# Patient Record
Sex: Female | Born: 1970 | Race: Black or African American | Hispanic: No | Marital: Married | Smoking: Current every day smoker
Health system: Southern US, Community
[De-identification: ages and names within clinical notes are randomized; demographics above are authoritative.]

## PROBLEM LIST (undated history)

## (undated) DIAGNOSIS — I10 Essential (primary) hypertension: Secondary | ICD-10-CM

## (undated) DIAGNOSIS — R232 Flushing: Secondary | ICD-10-CM

## (undated) DIAGNOSIS — F329 Major depressive disorder, single episode, unspecified: Secondary | ICD-10-CM

## (undated) DIAGNOSIS — R87619 Unspecified abnormal cytological findings in specimens from cervix uteri: Secondary | ICD-10-CM

## (undated) DIAGNOSIS — N760 Acute vaginitis: Secondary | ICD-10-CM

## (undated) DIAGNOSIS — B9689 Other specified bacterial agents as the cause of diseases classified elsewhere: Secondary | ICD-10-CM

## (undated) DIAGNOSIS — K219 Gastro-esophageal reflux disease without esophagitis: Secondary | ICD-10-CM

## (undated) DIAGNOSIS — R110 Nausea: Secondary | ICD-10-CM

## (undated) DIAGNOSIS — R519 Headache, unspecified: Secondary | ICD-10-CM

## (undated) DIAGNOSIS — N800 Endometriosis of uterus: Secondary | ICD-10-CM

## (undated) DIAGNOSIS — R51 Headache: Secondary | ICD-10-CM

## (undated) DIAGNOSIS — F32A Depression, unspecified: Secondary | ICD-10-CM

## (undated) DIAGNOSIS — R0602 Shortness of breath: Secondary | ICD-10-CM

## (undated) DIAGNOSIS — R002 Palpitations: Secondary | ICD-10-CM

## (undated) DIAGNOSIS — N8003 Adenomyosis of the uterus: Secondary | ICD-10-CM

## (undated) DIAGNOSIS — R195 Other fecal abnormalities: Secondary | ICD-10-CM

## (undated) DIAGNOSIS — F419 Anxiety disorder, unspecified: Secondary | ICD-10-CM

## (undated) DIAGNOSIS — R42 Dizziness and giddiness: Secondary | ICD-10-CM

## (undated) DIAGNOSIS — D649 Anemia, unspecified: Secondary | ICD-10-CM

## (undated) DIAGNOSIS — A419 Sepsis, unspecified organism: Secondary | ICD-10-CM

## (undated) DIAGNOSIS — R748 Abnormal levels of other serum enzymes: Secondary | ICD-10-CM

## (undated) DIAGNOSIS — R55 Syncope and collapse: Secondary | ICD-10-CM

## (undated) DIAGNOSIS — N939 Abnormal uterine and vaginal bleeding, unspecified: Secondary | ICD-10-CM

## (undated) DIAGNOSIS — R7303 Prediabetes: Secondary | ICD-10-CM

## (undated) DIAGNOSIS — N950 Postmenopausal bleeding: Secondary | ICD-10-CM

## (undated) DIAGNOSIS — F1010 Alcohol abuse, uncomplicated: Secondary | ICD-10-CM

## (undated) HISTORY — DX: Postmenopausal bleeding: N95.0

## (undated) HISTORY — DX: Adenomyosis of the uterus: N80.03

## (undated) HISTORY — DX: Flushing: R23.2

## (undated) HISTORY — DX: Endometriosis of uterus: N80.0

## (undated) HISTORY — DX: Palpitations: R00.2

## (undated) HISTORY — PX: OTHER SURGICAL HISTORY: SHX169

## (undated) HISTORY — DX: Other fecal abnormalities: R19.5

## (undated) HISTORY — DX: Dizziness and giddiness: R42

## (undated) HISTORY — DX: Syncope and collapse: R55

## (undated) HISTORY — DX: Shortness of breath: R06.02

## (undated) HISTORY — DX: Nausea: R11.0

## (undated) HISTORY — DX: Unspecified abnormal cytological findings in specimens from cervix uteri: R87.619

## (undated) HISTORY — DX: Alcohol abuse, uncomplicated: F10.10

## (undated) HISTORY — DX: Sepsis, unspecified organism: A41.9

## (undated) HISTORY — DX: Essential (primary) hypertension: I10

## (undated) HISTORY — DX: Abnormal uterine and vaginal bleeding, unspecified: N93.9

## (undated) HISTORY — DX: Abnormal levels of other serum enzymes: R74.8

## (undated) HISTORY — DX: Prediabetes: R73.03

## (undated) HISTORY — PX: DENTAL SURGERY: SHX609

---

## 1999-08-14 ENCOUNTER — Emergency Department (HOSPITAL_COMMUNITY): Admission: EM | Admit: 1999-08-14 | Discharge: 1999-08-14 | Payer: Self-pay | Admitting: Emergency Medicine

## 1999-09-04 ENCOUNTER — Emergency Department (HOSPITAL_COMMUNITY): Admission: EM | Admit: 1999-09-04 | Discharge: 1999-09-04 | Payer: Self-pay | Admitting: Emergency Medicine

## 1999-09-09 ENCOUNTER — Inpatient Hospital Stay (HOSPITAL_COMMUNITY): Admission: AD | Admit: 1999-09-09 | Discharge: 1999-09-09 | Payer: Self-pay | Admitting: Obstetrics & Gynecology

## 2001-12-03 ENCOUNTER — Emergency Department (HOSPITAL_COMMUNITY): Admission: EM | Admit: 2001-12-03 | Discharge: 2001-12-04 | Payer: Self-pay

## 2008-05-27 ENCOUNTER — Emergency Department (HOSPITAL_COMMUNITY): Admission: EM | Admit: 2008-05-27 | Discharge: 2008-05-27 | Payer: Self-pay | Admitting: Emergency Medicine

## 2009-01-16 ENCOUNTER — Emergency Department (HOSPITAL_COMMUNITY): Admission: EM | Admit: 2009-01-16 | Discharge: 2009-01-16 | Payer: Self-pay | Admitting: Emergency Medicine

## 2009-05-21 ENCOUNTER — Emergency Department (HOSPITAL_COMMUNITY): Admission: EM | Admit: 2009-05-21 | Discharge: 2009-05-21 | Payer: Self-pay | Admitting: Emergency Medicine

## 2009-07-06 ENCOUNTER — Emergency Department (HOSPITAL_COMMUNITY): Admission: EM | Admit: 2009-07-06 | Discharge: 2009-07-06 | Payer: Self-pay | Admitting: Emergency Medicine

## 2009-11-16 ENCOUNTER — Emergency Department (HOSPITAL_COMMUNITY): Admission: EM | Admit: 2009-11-16 | Discharge: 2009-11-16 | Payer: Self-pay | Admitting: Emergency Medicine

## 2009-12-01 ENCOUNTER — Emergency Department (HOSPITAL_COMMUNITY): Admission: EM | Admit: 2009-12-01 | Discharge: 2009-12-02 | Payer: Self-pay | Admitting: Emergency Medicine

## 2009-12-23 ENCOUNTER — Inpatient Hospital Stay (HOSPITAL_COMMUNITY): Admission: EM | Admit: 2009-12-23 | Discharge: 2009-12-25 | Payer: Self-pay | Admitting: Emergency Medicine

## 2009-12-28 ENCOUNTER — Emergency Department (HOSPITAL_COMMUNITY): Admission: EM | Admit: 2009-12-28 | Discharge: 2009-12-28 | Payer: Self-pay | Admitting: Emergency Medicine

## 2010-01-03 ENCOUNTER — Emergency Department (HOSPITAL_COMMUNITY): Admission: EM | Admit: 2010-01-03 | Discharge: 2010-01-03 | Payer: Self-pay | Admitting: Emergency Medicine

## 2010-02-22 ENCOUNTER — Emergency Department (HOSPITAL_COMMUNITY): Admission: EM | Admit: 2010-02-22 | Discharge: 2010-02-23 | Payer: Self-pay | Admitting: Emergency Medicine

## 2010-04-13 ENCOUNTER — Emergency Department (HOSPITAL_COMMUNITY): Admission: EM | Admit: 2010-04-13 | Discharge: 2010-04-13 | Payer: Self-pay | Admitting: Emergency Medicine

## 2010-04-19 ENCOUNTER — Inpatient Hospital Stay (HOSPITAL_COMMUNITY): Admission: EM | Admit: 2010-04-19 | Discharge: 2010-04-21 | Payer: Self-pay | Admitting: Emergency Medicine

## 2010-04-19 ENCOUNTER — Inpatient Hospital Stay (HOSPITAL_COMMUNITY): Admission: EM | Admit: 2010-04-19 | Discharge: 2010-04-19 | Payer: Self-pay | Admitting: Emergency Medicine

## 2010-04-29 ENCOUNTER — Emergency Department (HOSPITAL_COMMUNITY): Admission: EM | Admit: 2010-04-29 | Discharge: 2010-04-29 | Payer: Self-pay | Admitting: Emergency Medicine

## 2010-05-01 ENCOUNTER — Emergency Department (HOSPITAL_COMMUNITY): Admission: EM | Admit: 2010-05-01 | Discharge: 2010-05-02 | Payer: Self-pay | Admitting: Emergency Medicine

## 2010-06-25 ENCOUNTER — Emergency Department (HOSPITAL_COMMUNITY): Admission: EM | Admit: 2010-06-25 | Discharge: 2010-06-25 | Payer: Self-pay | Admitting: Emergency Medicine

## 2010-07-30 ENCOUNTER — Emergency Department (HOSPITAL_COMMUNITY): Admission: EM | Admit: 2010-07-30 | Discharge: 2010-07-30 | Payer: Self-pay | Admitting: Emergency Medicine

## 2010-10-20 ENCOUNTER — Emergency Department (HOSPITAL_COMMUNITY): Admission: EM | Admit: 2010-10-20 | Discharge: 2010-10-20 | Payer: Self-pay | Admitting: Emergency Medicine
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
## 2011-02-08 LAB — BASIC METABOLIC PANEL WITH GFR
BUN: 4 mg/dL — ABNORMAL LOW (ref 6–23)
Creatinine, Ser: 0.67 mg/dL (ref 0.4–1.2)
GFR calc non Af Amer: >60 mL/min (ref >60)

## 2011-02-08 LAB — DIFFERENTIAL
Basophils Absolute: 0 K/uL (ref 0.0–0.1)
Basophils Relative: 0 % (ref 0–1)
Eosinophils Absolute: 0 10*3/uL (ref 0.0–0.7)
Eosinophils Relative: 0 % (ref 0–5)
Lymphocytes Relative: 7 % — ABNORMAL LOW (ref 12–46)
Lymphs Abs: 1.3 10*3/uL (ref 0.7–4.0)
Monocytes Absolute: 1.5 10*3/uL — ABNORMAL HIGH (ref 0.1–1.0)
Monocytes Relative: 8 % (ref 3–12)
Neutro Abs: 16.3 10*3/uL — ABNORMAL HIGH (ref 1.7–7.7)
Neutrophils Relative %: 85 % — ABNORMAL HIGH (ref 43–77)

## 2011-02-08 LAB — CBC
HCT: 25.8 % — ABNORMAL LOW (ref 36.0–46.0)
Hemoglobin: 8 g/dL — ABNORMAL LOW (ref 12.0–15.0)
MCH: 22.5 pg — ABNORMAL LOW (ref 26.0–34.0)
MCHC: 31 g/dL (ref 30.0–36.0)
MCV: 72.7 fL — ABNORMAL LOW (ref 78.0–100.0)
Platelets: 411 K/uL — ABNORMAL HIGH (ref 150–400)
RBC: 3.55 MIL/uL — ABNORMAL LOW (ref 3.87–5.11)
RDW: 18.6 % — ABNORMAL HIGH (ref 11.5–15.5)
WBC: 19.2 K/uL — ABNORMAL HIGH (ref 4.0–10.5)

## 2011-02-08 LAB — BASIC METABOLIC PANEL
CO2: 29 mEq/L (ref 19–32)
Calcium: 8.5 mg/dL (ref 8.4–10.5)
Chloride: 101 mEq/L (ref 96–112)
GFR calc Af Amer: >60 mL/min (ref >60)
Glucose, Bld: 98 mg/dL (ref 70–99)
Potassium: 3.6 mEq/L (ref 3.5–5.1)
Sodium: 136 mEq/L (ref 135–145)

## 2011-02-08 LAB — WET PREP, GENITAL
Trich, Wet Prep: NONE SEEN
Yeast Wet Prep HPF POC: NONE SEEN

## 2011-02-08 LAB — RPR: RPR Ser Ql: NONREACTIVE

## 2011-02-08 LAB — GC/CHLAMYDIA PROBE AMP, GENITAL
Chlamydia, DNA Probe: NEGATIVE
GC Probe Amp, Genital: POSITIVE — AB

## 2011-02-10 LAB — POCT I-STAT, CHEM 8
Calcium, Ion: 0.95 mmol/L — ABNORMAL LOW (ref 1.12–1.32)
Glucose, Bld: 91 mg/dL (ref 70–99)
HCT: 37 % (ref 36.0–46.0)
Hemoglobin: 12.6 g/dL (ref 12.0–15.0)

## 2011-02-10 LAB — POCT PREGNANCY, URINE: Preg Test, Ur: NEGATIVE

## 2011-02-12 LAB — POCT PREGNANCY, URINE: Preg Test, Ur: NEGATIVE

## 2011-02-12 LAB — DIFFERENTIAL
Band Neutrophils: 0 % (ref 0–10)
Basophils Relative: 0 % (ref 0–1)
Blasts: 0 %
Lymphocytes Relative: 39 % (ref 12–46)
Lymphs Abs: 2.7 10*3/uL (ref 0.7–4.0)
Monocytes Absolute: 0.6 10*3/uL (ref 0.1–1.0)
Monocytes Relative: 9 % (ref 3–12)
Neutro Abs: 3.4 10*3/uL (ref 1.7–7.7)
Neutrophils Relative %: 51 % (ref 43–77)

## 2011-02-12 LAB — HEMOGLOBIN AND HEMATOCRIT, BLOOD
HCT: 27.3 % — ABNORMAL LOW (ref 36.0–46.0)
Hemoglobin: 8.7 g/dL — ABNORMAL LOW (ref 12.0–15.0)

## 2011-02-12 LAB — CBC
HCT: 28.5 % — ABNORMAL LOW (ref 36.0–46.0)
Hemoglobin: 9 g/dL — ABNORMAL LOW (ref 12.0–15.0)
WBC: 6.8 10*3/uL (ref 4.0–10.5)

## 2011-02-12 LAB — POCT I-STAT, CHEM 8
BUN: 15 mg/dL (ref 6–23)
Calcium, Ion: 1.16 mmol/L (ref 1.12–1.32)
Glucose, Bld: 82 mg/dL (ref 70–99)
TCO2: 24 mmol/L (ref 0–100)

## 2011-02-12 LAB — URINE MICROSCOPIC-ADD ON

## 2011-02-12 LAB — POCT CARDIAC MARKERS: Troponin i, poc: <0.05 ng/mL (ref 0.00–0.09)

## 2011-02-12 LAB — URINALYSIS, ROUTINE W REFLEX MICROSCOPIC

## 2011-02-13 LAB — COMPREHENSIVE METABOLIC PANEL
AST: 31 U/L (ref 0–37)
Albumin: 4 g/dL (ref 3.5–5.2)
Alkaline Phosphatase: 75 U/L (ref 39–117)
BUN: 7 mg/dL (ref 6–23)
Chloride: 103 mEq/L (ref 96–112)
GFR calc Af Amer: >60 mL/min (ref >60)
Potassium: 3.4 mEq/L — ABNORMAL LOW (ref 3.5–5.1)
Sodium: 137 mEq/L (ref 135–145)
Total Bilirubin: 0.2 mg/dL — ABNORMAL LOW (ref 0.3–1.2)
Total Protein: 8 g/dL (ref 6.0–8.3)

## 2011-02-13 LAB — DIFFERENTIAL
Basophils Absolute: 0 10*3/uL (ref 0.0–0.1)
Basophils Relative: 1 % (ref 0–1)
Eosinophils Relative: 2 % (ref 0–5)
Monocytes Absolute: 0.5 10*3/uL (ref 0.1–1.0)
Monocytes Relative: 6 % (ref 3–12)
Neutro Abs: 4 10*3/uL (ref 1.7–7.7)

## 2011-02-13 LAB — URINALYSIS, ROUTINE W REFLEX MICROSCOPIC
Hgb urine dipstick: NEGATIVE
Nitrite: NEGATIVE
Protein, ur: NEGATIVE mg/dL
Specific Gravity, Urine: 1.007 (ref 1.005–1.030)
Urobilinogen, UA: 0.2 mg/dL (ref 0.0–1.0)

## 2011-02-13 LAB — ETHANOL: Alcohol, Ethyl (B): 167 mg/dL — ABNORMAL HIGH (ref 0–10)

## 2011-02-13 LAB — CBC
Platelets: 373 10*3/uL (ref 150–400)
RDW: 18.4 % — ABNORMAL HIGH (ref 11.5–15.5)
WBC: 7.1 10*3/uL (ref 4.0–10.5)

## 2011-02-14 LAB — COMPREHENSIVE METABOLIC PANEL
ALT: 15 U/L (ref 0–35)
AST: 25 U/L (ref 0–37)
AST: 30 U/L (ref 0–37)
Albumin: 3.9 g/dL (ref 3.5–5.2)
Albumin: 3.1 g/dL — ABNORMAL LOW (ref 3.5–5.2)
Alkaline Phosphatase: 48 U/L (ref 39–117)
Alkaline Phosphatase: 59 U/L (ref 39–117)
BUN: 5 mg/dL — ABNORMAL LOW (ref 6–23)
BUN: 3 mg/dL — ABNORMAL LOW (ref 6–23)
CO2: 21 mEq/L (ref 19–32)
CO2: 25 mEq/L (ref 19–32)
CO2: 27 mEq/L (ref 19–32)
Calcium: 8.7 mg/dL (ref 8.4–10.5)
Calcium: 7.9 mg/dL — ABNORMAL LOW (ref 8.4–10.5)
Chloride: 108 mEq/L (ref 96–112)
Chloride: 109 mEq/L (ref 96–112)
Chloride: 110 mEq/L (ref 96–112)
Creatinine, Ser: 0.7 mg/dL (ref 0.4–1.2)
GFR calc Af Amer: >60 mL/min (ref >60)
GFR calc Af Amer: >60 mL/min (ref >60)
GFR calc non Af Amer: >60 mL/min (ref >60)
GFR calc non Af Amer: >60 mL/min (ref >60)
GFR calc non Af Amer: >60 mL/min (ref >60)
Glucose, Bld: 105 mg/dL — ABNORMAL HIGH (ref 70–99)
Glucose, Bld: 82 mg/dL (ref 70–99)
Potassium: 3.4 mEq/L — ABNORMAL LOW (ref 3.5–5.1)
Potassium: 3.5 mEq/L (ref 3.5–5.1)
Sodium: 139 mEq/L (ref 135–145)
Sodium: 141 mEq/L (ref 135–145)
Total Bilirubin: <0.1 mg/dL — ABNORMAL LOW (ref 0.3–1.2)
Total Bilirubin: 0.2 mg/dL — ABNORMAL LOW (ref 0.3–1.2)
Total Bilirubin: 0.2 mg/dL — ABNORMAL LOW (ref 0.3–1.2)

## 2011-02-14 LAB — GRAM STAIN

## 2011-02-14 LAB — CBC
HCT: 26.9 % — ABNORMAL LOW (ref 36.0–46.0)
HCT: 27 % — ABNORMAL LOW (ref 36.0–46.0)
HCT: 33.5 % — ABNORMAL LOW (ref 36.0–46.0)
Hemoglobin: 8.5 g/dL — ABNORMAL LOW (ref 12.0–15.0)
Hemoglobin: 8.5 g/dL — ABNORMAL LOW (ref 12.0–15.0)
Hemoglobin: 8.5 g/dL — ABNORMAL LOW (ref 12.0–15.0)
Hemoglobin: 9 g/dL — ABNORMAL LOW (ref 12.0–15.0)
Hemoglobin: 9.1 g/dL — ABNORMAL LOW (ref 12.0–15.0)
Hemoglobin: 10 g/dL — ABNORMAL LOW (ref 12.0–15.0)
MCHC: 30.8 g/dL (ref 30.0–36.0)
MCHC: 31.4 g/dL (ref 30.0–36.0)
MCHC: 31.5 g/dL (ref 30.0–36.0)
MCHC: 32 g/dL (ref 30.0–36.0)
MCHC: 32.3 g/dL (ref 30.0–36.0)
MCHC: 31.6 g/dL (ref 30.0–36.0)
MCV: 75.2 fL — ABNORMAL LOW (ref 78.0–100.0)
MCV: 75.3 fL — ABNORMAL LOW (ref 78.0–100.0)
MCV: 75.5 fL — ABNORMAL LOW (ref 78.0–100.0)
MCV: 75.6 fL — ABNORMAL LOW (ref 78.0–100.0)
MCV: 75.7 fL — ABNORMAL LOW (ref 78.0–100.0)
MCV: 75.8 fL — ABNORMAL LOW (ref 78.0–100.0)
MCV: 76.3 fL — ABNORMAL LOW (ref 78.0–100.0)
Platelets: 228 10*3/uL (ref 150–400)
Platelets: 254 10*3/uL (ref 150–400)
Platelets: 289 10*3/uL (ref 150–400)
Platelets: 343 10*3/uL (ref 150–400)
Platelets: 295 10*3/uL (ref 150–400)
Platelets: 450 10*3/uL — ABNORMAL HIGH (ref 150–400)
RBC: 3.59 MIL/uL — ABNORMAL LOW (ref 3.87–5.11)
RBC: 3.69 MIL/uL — ABNORMAL LOW (ref 3.87–5.11)
RBC: 3.8 MIL/uL — ABNORMAL LOW (ref 3.87–5.11)
RBC: 4.1 MIL/uL (ref 3.87–5.11)
RBC: 4.17 MIL/uL (ref 3.87–5.11)
RDW: 18.8 % — ABNORMAL HIGH (ref 11.5–15.5)
RDW: 19.2 % — ABNORMAL HIGH (ref 11.5–15.5)
RDW: 19.4 % — ABNORMAL HIGH (ref 11.5–15.5)
RDW: 20 % — ABNORMAL HIGH (ref 11.5–15.5)
WBC: 3.6 10*3/uL — ABNORMAL LOW (ref 4.0–10.5)
WBC: 5.1 10*3/uL (ref 4.0–10.5)
WBC: 6.7 10*3/uL (ref 4.0–10.5)
WBC: 10.2 10*3/uL (ref 4.0–10.5)
WBC: 6.7 10*3/uL (ref 4.0–10.5)

## 2011-02-14 LAB — GLUCOSE, CAPILLARY
Glucose-Capillary: 115 mg/dL — ABNORMAL HIGH (ref 70–99)
Glucose-Capillary: 60 mg/dL — ABNORMAL LOW (ref 70–99)
Glucose-Capillary: 71 mg/dL (ref 70–99)
Glucose-Capillary: 77 mg/dL (ref 70–99)
Glucose-Capillary: 85 mg/dL (ref 70–99)
Glucose-Capillary: 98 mg/dL (ref 70–99)

## 2011-02-14 LAB — URINALYSIS, ROUTINE W REFLEX MICROSCOPIC
Bilirubin Urine: NEGATIVE
Bilirubin Urine: NEGATIVE
Bilirubin Urine: NEGATIVE
Glucose, UA: NEGATIVE mg/dL
Glucose, UA: NEGATIVE mg/dL
Glucose, UA: NEGATIVE mg/dL
Hgb urine dipstick: NEGATIVE
Hgb urine dipstick: NEGATIVE
Ketones, ur: NEGATIVE mg/dL
Ketones, ur: NEGATIVE mg/dL
Nitrite: NEGATIVE
Protein, ur: NEGATIVE mg/dL
Protein, ur: NEGATIVE mg/dL
Specific Gravity, Urine: 1.011 (ref 1.005–1.030)
Specific Gravity, Urine: 1.005 (ref 1.005–1.030)
Urobilinogen, UA: 0.2 mg/dL (ref 0.0–1.0)
pH: 5.5 (ref 5.0–8.0)
pH: 5.5 (ref 5.0–8.0)

## 2011-02-14 LAB — TYPE AND SCREEN
ABO/RH(D): O NEG
Antibody Screen: NEGATIVE

## 2011-02-14 LAB — DIFFERENTIAL
Basophils Absolute: 0 10*3/uL (ref 0.0–0.1)
Eosinophils Absolute: 0.2 10*3/uL (ref 0.0–0.7)
Eosinophils Relative: 1 % (ref 0–5)
Eosinophils Relative: 3 % (ref 0–5)
Eosinophils Relative: 0 % (ref 0–5)
Lymphocytes Relative: 49 % — ABNORMAL HIGH (ref 12–46)
Lymphocytes Relative: 27 % (ref 12–46)
Lymphs Abs: 2.5 10*3/uL (ref 0.7–4.0)
Lymphs Abs: 3.2 10*3/uL (ref 0.7–4.0)
Lymphs Abs: 1.8 10*3/uL (ref 0.7–4.0)
Monocytes Absolute: 0.4 10*3/uL (ref 0.1–1.0)
Monocytes Absolute: 0.8 10*3/uL (ref 0.1–1.0)
Monocytes Relative: 8 % (ref 3–12)
Monocytes Relative: 8 % (ref 3–12)
Neutrophils Relative %: 66 % (ref 43–77)
Neutrophils Relative %: 80 % — ABNORMAL HIGH (ref 43–77)

## 2011-02-14 LAB — VITAMIN B12: Vitamin B-12: 479 pg/mL (ref 211–911)

## 2011-02-14 LAB — CARDIAC PANEL(CRET KIN+CKTOT+MB+TROPI)
CK, MB: 1.7 ng/mL (ref 0.3–4.0)
Relative Index: 1 (ref 0.0–2.5)
Relative Index: 1 (ref 0.0–2.5)
Total CK: 163 U/L (ref 7–177)

## 2011-02-14 LAB — IRON AND TIBC
Iron: 13 ug/dL — ABNORMAL LOW (ref 42–135)
UIBC: 350 ug/dL

## 2011-02-14 LAB — BASIC METABOLIC PANEL
CO2: 27 mEq/L (ref 19–32)
Chloride: 105 mEq/L (ref 96–112)
GFR calc Af Amer: >60 mL/min (ref >60)
Sodium: 137 mEq/L (ref 135–145)

## 2011-02-14 LAB — RAPID URINE DRUG SCREEN, HOSP PERFORMED
Amphetamines: NOT DETECTED
Barbiturates: NOT DETECTED
Benzodiazepines: NOT DETECTED

## 2011-02-14 LAB — PROTIME-INR
INR: 1.05 (ref 0.00–1.49)
Prothrombin Time: 13.6 seconds (ref 11.6–15.2)

## 2011-02-14 LAB — ETHANOL
Alcohol, Ethyl (B): 303 mg/dL — ABNORMAL HIGH (ref 0–10)
Alcohol, Ethyl (B): 225 mg/dL — ABNORMAL HIGH (ref 0–10)

## 2011-02-14 LAB — RETICULOCYTES
RBC.: 3.84 MIL/uL — ABNORMAL LOW (ref 3.87–5.11)
Retic Count, Absolute: 23 10*3/uL (ref 19.0–186.0)
Retic Ct Pct: 0.6 % (ref 0.4–3.1)

## 2011-02-14 LAB — POCT I-STAT, CHEM 8
BUN: 4 mg/dL — ABNORMAL LOW (ref 6–23)
BUN: <3 mg/dL — ABNORMAL LOW (ref 6–23)
Creatinine, Ser: 0.9 mg/dL (ref 0.4–1.2)
Creatinine, Ser: 0.6 mg/dL (ref 0.4–1.2)
Creatinine, Ser: 0.8 mg/dL (ref 0.4–1.2)
Glucose, Bld: 95 mg/dL (ref 70–99)
Hemoglobin: 12.2 g/dL (ref 12.0–15.0)
Hemoglobin: 11.6 g/dL — ABNORMAL LOW (ref 12.0–15.0)
Hemoglobin: 11.9 g/dL — ABNORMAL LOW (ref 12.0–15.0)
Potassium: 3.4 mEq/L — ABNORMAL LOW (ref 3.5–5.1)
Potassium: 3.4 mEq/L — ABNORMAL LOW (ref 3.5–5.1)
Sodium: 142 mEq/L (ref 135–145)
Sodium: 133 mEq/L — ABNORMAL LOW (ref 135–145)
TCO2: 22 mmol/L (ref 0–100)

## 2011-02-14 LAB — CULTURE, ROUTINE-ABSCESS

## 2011-02-14 LAB — URINE MICROSCOPIC-ADD ON

## 2011-02-14 LAB — ANAEROBIC CULTURE

## 2011-02-14 LAB — APTT: aPTT: 28 seconds (ref 24–37)

## 2011-02-14 LAB — TSH: TSH: 1.221 u[IU]/mL (ref 0.350–4.500)

## 2011-02-14 LAB — HEMOCCULT GUIAC POC 1CARD (OFFICE): Fecal Occult Bld: POSITIVE

## 2011-02-14 LAB — LIPASE, BLOOD: Lipase: 32 U/L (ref 11–59)

## 2011-02-16 LAB — ETHANOL: Alcohol, Ethyl (B): 346 mg/dL — ABNORMAL HIGH (ref 0–10)

## 2011-02-16 LAB — GLUCOSE, CAPILLARY: Glucose-Capillary: 92 mg/dL (ref 70–99)

## 2011-02-21 LAB — GLUCOSE, CAPILLARY: Glucose-Capillary: 108 mg/dL — ABNORMAL HIGH (ref 70–99)

## 2011-03-05 LAB — POCT PREGNANCY, URINE: Preg Test, Ur: NEGATIVE

## 2011-03-05 LAB — GC/CHLAMYDIA PROBE AMP, GENITAL: GC Probe Amp, Genital: POSITIVE — AB

## 2011-03-05 LAB — WET PREP, GENITAL: Yeast Wet Prep HPF POC: NONE SEEN

## 2011-03-05 LAB — URINALYSIS, ROUTINE W REFLEX MICROSCOPIC
Glucose, UA: NEGATIVE mg/dL
Ketones, ur: 15 mg/dL — AB
Protein, ur: 100 mg/dL — AB
pH: 6 (ref 5.0–8.0)

## 2011-03-05 LAB — URINE MICROSCOPIC-ADD ON

## 2011-03-15 LAB — RAPID URINE DRUG SCREEN, HOSP PERFORMED
Amphetamines: NOT DETECTED
Barbiturates: NOT DETECTED
Benzodiazepines: NOT DETECTED
Cocaine: NOT DETECTED
Opiates: NOT DETECTED

## 2011-03-15 LAB — URINALYSIS, ROUTINE W REFLEX MICROSCOPIC
Bilirubin Urine: NEGATIVE
Nitrite: NEGATIVE
Specific Gravity, Urine: 1.009 (ref 1.005–1.030)
pH: 5 (ref 5.0–8.0)

## 2011-03-15 LAB — POCT I-STAT, CHEM 8
BUN: 3 mg/dL — ABNORMAL LOW (ref 6–23)
Calcium, Ion: 1.03 mmol/L — ABNORMAL LOW (ref 1.12–1.32)
Creatinine, Ser: 0.7 mg/dL (ref 0.4–1.2)
Glucose, Bld: 73 mg/dL (ref 70–99)
Hemoglobin: 14.3 g/dL (ref 12.0–15.0)
Sodium: 141 mEq/L (ref 135–145)
TCO2: 24 mmol/L (ref 0–100)

## 2011-03-15 LAB — URINE MICROSCOPIC-ADD ON

## 2011-03-15 LAB — URINE CULTURE: Colony Count: 10000

## 2011-04-12 NOTE — Consult Note (Signed)
NAMECHARLISSA, [name]              ACCOUNT NO.:  [id number]   MEDICAL RECORD NO.:  [id number]          PATIENT TYPE:  EMS   LOCATION:  [facility]                         FACILITY:  [facility]   PHYSICIAN:  Karol T. Lazarus Salines, M.D. DATE OF BIRTH:  01-Feb-1971   DATE OF CONSULTATION:  05/27/2008  DATE OF DISCHARGE:  05/27/2008                                 CONSULTATION   CHIEF COMPLAINT:  Lip laceration.   HISTORY:  A 40 year old black female allegedly tripped and fell striking  her lower lip against iron bed frame sustaining a large laceration.  She  looked in the mirror and recognized that it was substantial and came  into the emergency room.  She had some bleeding at home, which stopped  spontaneously.  She feels like her upper lateral right incisor may be  slightly loose, but was not knocked clean nor were any other teeth  injured.  She is breathing comfortably.  The pain is moderate.  She did  not lose consciousness.   PAST MEDICAL HISTORY:  Noncontributory.   SOCIAL HISTORY:  She is here with her boyfriend.  She both smokes and  drinks alcohol.   FAMILY HISTORY:  Noncontributory.   REVIEW OF SYSTEMS:  Noncontributory.   PHYSICAL EXAMINATION:  GENERAL:  This is a pleasant middle-aged black  female.  HEENT:  A large full-thickness laceration of the lower lip just off-  centered to the right of midline.  The portion, which traverses lip is  basically perpendicular to the vermilion line and then angulates on the  interior mucosal surface.  It extends onto the skin of the exterior lip  for a minimal distance.  No active bleeding noted.  The teeth looked  relatively intact.  She does smell of alcohol.  Mental status is  basically intact.  She hears well in conversational speech.  Voice is  clear and respirations unlabored through nose and mouth.  The head is  otherwise atraumatic and neck supple.  I did not examine ears or nose.  The oral cavity is clear.  She has the laceration of the  lower lip as  described above.  NECK:  Unremarkable.  NEUROLOGIC:  Cranial nerves intact.   IMPRESSION:  Full-thickness laceration of the lower lip with good  cosmetic orientation.   PLAN:  I discussed this with the patient.  I recommend we close this  under local anesthesia.  She is receptive.   I began local anesthesia with Cetacaine spray into the lower  gingivobuccal sulcus on both sides, following which, I infiltrated 10 mL  total of 1% Xylocaine with 1:100,000 epinephrine to perform a mental  nerve block on each side.  Several minutes were allowed for this to take  effect.  Good local anesthesia was achieved.  The face and wound was  cleaned with a 50/50 mixture of Betadine solution and saline.  The  patient did receive a tetanus vaccination in the emergency room.   The deep tissue spaces were reapproximated using interrupted 4-0 Vicryl  sutures.  The vermilion-cutaneous junction was carefully reapproximated  using a 5-0 Ethilon stitch.  Several additional  sutures were applied to  the skin of the lower lip, and then the same suture was used to suture  on the external portion of the lips mucosa.  Upon reaching the moist  portion of the lip, the suture was changed to 5-0 chromic gut, which was  carried down into the interior surface.  A good cosmetic reapproximation  of the lip was accomplished.  The patient tolerated the procedure  beautifully.   I talked with her about ice, elevation, and wound hygiene.  I gave her a  prescription for Vicodin 5/500 pain relief.  I will see her back in 7-8  days for suture removal.  She will stay with softer diet for the time-  being and advance as comfortable.  She understands and agrees with the  discussion and plans.      Gloris Manchester. Lazarus Salines, M.D.  Electronically Signed     KTW/MEDQ  D:  05/27/2008  T:  05/28/2008  Job:  [id number]

## 2012-11-23 ENCOUNTER — Emergency Department (HOSPITAL_COMMUNITY)
Admission: EM | Admit: 2012-11-23 | Discharge: 2012-11-23 | Disposition: A | Discharge status: Home / Self Care | Payer: Self-pay | Attending: Emergency Medicine | Admitting: Emergency Medicine

## 2012-11-23 ENCOUNTER — Encounter (HOSPITAL_COMMUNITY): Payer: Self-pay | Admitting: Emergency Medicine

## 2012-11-23 DIAGNOSIS — J329 Chronic sinusitis, unspecified: Secondary | ICD-10-CM | POA: Insufficient documentation

## 2012-11-23 DIAGNOSIS — H9210 Otorrhea, unspecified ear: Secondary | ICD-10-CM | POA: Insufficient documentation

## 2012-11-23 DIAGNOSIS — J069 Acute upper respiratory infection, unspecified: Secondary | ICD-10-CM | POA: Insufficient documentation

## 2012-11-23 DIAGNOSIS — Z8619 Personal history of other infectious and parasitic diseases: Secondary | ICD-10-CM | POA: Insufficient documentation

## 2012-11-23 DIAGNOSIS — R059 Cough, unspecified: Secondary | ICD-10-CM | POA: Insufficient documentation

## 2012-11-23 DIAGNOSIS — F17200 Nicotine dependence, unspecified, uncomplicated: Secondary | ICD-10-CM | POA: Insufficient documentation

## 2012-11-23 DIAGNOSIS — R05 Cough: Secondary | ICD-10-CM | POA: Insufficient documentation

## 2012-11-23 DIAGNOSIS — J029 Acute pharyngitis, unspecified: Secondary | ICD-10-CM | POA: Insufficient documentation

## 2012-11-23 DIAGNOSIS — R6883 Chills (without fever): Secondary | ICD-10-CM | POA: Insufficient documentation

## 2012-11-23 DIAGNOSIS — R6889 Other general symptoms and signs: Secondary | ICD-10-CM | POA: Insufficient documentation

## 2012-11-23 DIAGNOSIS — R1110 Vomiting, unspecified: Secondary | ICD-10-CM | POA: Insufficient documentation

## 2012-11-23 MED ORDER — IBUPROFEN 600 MG PO TABS: 600.0000 mg | ORAL_TABLET | Freq: Four times a day (QID) | ORAL | Status: DC | PRN

## 2012-11-23 MED ORDER — OXYMETAZOLINE HCL 0.05 % NA SOLN: 2.0000 | Freq: Two times a day (BID) | NASAL | Status: DC

## 2012-11-23 MED ORDER — MOMETASONE FUROATE 50 MCG/ACT NA SUSP: 2.0000 | Freq: Every day | NASAL | Status: DC

## 2012-11-23 NOTE — ED Notes (Signed)
Pt had abscess surgery 3-4 years ago from tooth decay that spread into left-side of jaw. Today pt has earache and pain on same left side of jaw. A&Ox4, ambulatory, nad.

## 2012-11-23 NOTE — ED Notes (Signed)
Pt c/o left earache two days with yellow drainage. Tenderness behind left ear radiating to left jaw, no visible swelling noted. Pt's spouse states that pt sounds slurred in speech. Pt reports hoarse voice that has been worsening since onset of earache. A&Ox4, ambulatory, nad.

## 2012-11-23 NOTE — ED Provider Notes (Signed)
History  This chart was scribed for Terry Racer, MD by Bennett Scrape, ED Scribe. This patient was seen in room TR05C/TR05C and the patient's care was started at 1:04 PM.   CSN: [id number]  Arrival date & time 11/23/12  1148   First MD Initiated Contact with Patient 11/23/12 1304      Chief Complaint  Patient presents with  . Otalgia    Patient is a 41 y.o. female presenting with ear pain. The history is provided by the patient. No language interpreter was used.  Otalgia This is a new problem. The current episode started 2 days ago. There is pain in the left ear. The problem occurs constantly. The problem has been gradually worsening. There has been no fever. The pain is moderate. Associated symptoms include ear discharge, sore throat, vomiting (post-tussive) and cough. Pertinent negatives include no abdominal pain and no diarrhea. Her past medical history does not include hearing loss.    [name] is a 41 y.o. female who presents to the Emergency Department complaining of 2 days of gradual onset, gradually worsening, constant left otalgia that radiates behind the ear with associated yellow drainage, sneezing, sore throat, cough, 4 episodes of post-tussive emesis and chills. The pain is worse with loud noises. She reports trying to clean the ear with Q-tips and irrigating it with no improvement. She reports one prior episode of similar symptoms with a facial abscess. She denies fevers and hearing loss as associated symptoms. She does not have a h/o chronic medical conditions and is a current everyday smoker and occasional alcohol user.  No past medical history on file.  Past Surgical History  Procedure Date  . Abscess     No family history on file.  History  Substance Use Topics  . Smoking status: Current Every Day Smoker  . Smokeless tobacco: Not on file  . Alcohol Use: Yes    No OB history provided.  Review of Systems  Constitutional: Positive for chills.  Negative for fever.  HENT: Positive for ear pain (left), sore throat, sneezing and ear discharge. Negative for congestion.   Respiratory: Positive for cough. Negative for shortness of breath.   Gastrointestinal: Positive for vomiting (post-tussive). Negative for nausea, abdominal pain and diarrhea.  All other systems reviewed and are negative.    Allergies  Morphine and related and Penicillins  Home Medications   Current Outpatient Rx  Name  Route  Sig  Dispense  Refill  . IBUPROFEN 200 MG PO TABS   Oral   Take 400 mg by mouth every 6 (six) hours as needed. For pain         . IBUPROFEN 600 MG PO TABS   Oral   Take 1 tablet (600 mg total) by mouth every 6 (six) hours as needed for pain.   30 tablet   0   . MOMETASONE FUROATE 50 MCG/ACT NA SUSP   Nasal   Place 2 sprays into the nose daily.   17 g   12   . OXYMETAZOLINE HCL 0.05 % NA SOLN   Nasal   Place 2 sprays into the nose 2 (two) times daily.   30 mL   0     Triage Vitals: BP 133/107  Pulse 88  Temp 98.1 F (36.7 C) (Oral)  Resp 18  SpO2 100%  LMP 11/17/2012  Physical Exam  Nursing note and vitals reviewed. Constitutional: She is oriented to person, place, and time. She appears well-developed and well-nourished. No distress.  HENT:  Head: Normocephalic and atraumatic.       Bilateral TM bulging, bilateral turbinate edema, no erythema, left maxillary sinus tenderness to percussion  Eyes: EOM are normal.  Neck: Neck supple. No tracheal deviation present.  Cardiovascular: Normal rate and regular rhythm.   Pulmonary/Chest: Effort normal and breath sounds normal. No respiratory distress.  Musculoskeletal: Normal range of motion.  Neurological: She is alert and oriented to person, place, and time.  Skin: Skin is warm and dry.  Psychiatric: She has a normal mood and affect. Her behavior is normal.    ED Course  Procedures (including critical care time)  DIAGNOSTIC STUDIES: Oxygen Saturation is 100% on  room air, normal by my interpretation.    COORDINATION OF CARE: 1:17 PM- Discussed discharge plan which includes nasal sprays with pt at bedside and pt agreed to plan. Addressed pt's concerns over BP and advised her to follow up with her PCP.  1:20 PM- Prescribed Nasonex nasal spray, Afrin nasal spray and 600 mg ibuprofen tablets  Labs Reviewed - No data to display No results found.   1. Sinusitis   2. Acute URI       MDM  I personally performed the services described in this documentation, which was scribed in my presence. The recorded information has been reviewed and is accurate.     Terry Racer, MD 11/23/12 [phone]

## 2012-11-23 NOTE — ED Notes (Signed)
Pt also reports N/V x2 days.

## 2015-06-11 ENCOUNTER — Emergency Department (HOSPITAL_COMMUNITY)
Admission: EM | Admit: 2015-06-11 | Discharge: 2015-06-11 | Disposition: A | Discharge status: Home / Self Care | Payer: Self-pay | Attending: Emergency Medicine | Admitting: Emergency Medicine

## 2015-06-11 ENCOUNTER — Encounter (HOSPITAL_COMMUNITY): Payer: Self-pay | Admitting: Neurology

## 2015-06-11 DIAGNOSIS — Z720 Tobacco use: Secondary | ICD-10-CM | POA: Insufficient documentation

## 2015-06-11 DIAGNOSIS — Z3202 Encounter for pregnancy test, result negative: Secondary | ICD-10-CM | POA: Insufficient documentation

## 2015-06-11 DIAGNOSIS — D649 Anemia, unspecified: Secondary | ICD-10-CM | POA: Insufficient documentation

## 2015-06-11 DIAGNOSIS — N939 Abnormal uterine and vaginal bleeding, unspecified: Secondary | ICD-10-CM

## 2015-06-11 DIAGNOSIS — N938 Other specified abnormal uterine and vaginal bleeding: Secondary | ICD-10-CM | POA: Insufficient documentation

## 2015-06-11 DIAGNOSIS — R42 Dizziness and giddiness: Secondary | ICD-10-CM | POA: Insufficient documentation

## 2015-06-11 LAB — CBC WITH DIFFERENTIAL/PLATELET
BASOS ABS: 0 10*3/uL (ref 0.0–0.1)
Basophils Relative: 0 % (ref 0–1)
EOS ABS: 0.1 10*3/uL (ref 0.0–0.7)
Eosinophils Relative: 1 % (ref 0–5)
HEMATOCRIT: 33.2 % — AB (ref 36.0–46.0)
Hemoglobin: 10.8 g/dL — ABNORMAL LOW (ref 12.0–15.0)
LYMPHS ABS: 2.6 10*3/uL (ref 0.7–4.0)
Lymphocytes Relative: 39 % (ref 12–46)
MCH: 26.9 pg (ref 26.0–34.0)
MCHC: 32.5 g/dL (ref 30.0–36.0)
MCV: 82.8 fL (ref 78.0–100.0)
MONO ABS: 0.6 10*3/uL (ref 0.1–1.0)
Monocytes Relative: 9 % (ref 3–12)
NEUTROS PCT: 51 % (ref 43–77)
Neutro Abs: 3.3 10*3/uL (ref 1.7–7.7)
PLATELETS: 303 10*3/uL (ref 150–400)
RBC: 4.01 MIL/uL (ref 3.87–5.11)
RDW: 18.6 % — ABNORMAL HIGH (ref 11.5–15.5)
WBC: 6.6 10*3/uL (ref 4.0–10.5)

## 2015-06-11 LAB — I-STAT BETA HCG BLOOD, ED (MC, WL, AP ONLY): I-stat hCG, quantitative: <5 m[IU]/mL (ref <5)

## 2015-06-11 MED ORDER — FERROUS SULFATE 325 (65 FE) MG PO TABS: 325.0000 mg | ORAL_TABLET | Freq: Every day | ORAL | Status: DC

## 2015-06-11 MED ORDER — MEDROXYPROGESTERONE ACETATE 5 MG PO TABS: 5.0000 mg | ORAL_TABLET | Freq: Every day | ORAL | Status: DC

## 2015-06-11 NOTE — Discharge Instructions (Signed)
1. Medications:medroxyprogesterone, iron supplement, usual home medications 2. Treatment: rest, drink plenty of fluids,  3. Follow Up: Please followup with OB/GYN in 7days for discussion of your diagnoses and further evaluation after today's visit; if you do not have a primary care doctor use the resource guide provided to find one; Please return to the ER for increased bleeding, syncope, CP or SOB    Dysfunctional Uterine Bleeding Normally, menstrual periods begin between ages 38 to 67 in young women. A normal menstrual cycle/period may begin every 23 days up to 35 days and lasts from 1 to 7 days. Around 12 to 14 days before your menstrual period starts, ovulation (ovary produces an egg) occurs. When counting the time between menstrual periods, count from the first day of bleeding of the previous period to the first day of bleeding of the next period. Dysfunctional (abnormal) uterine bleeding is bleeding that is different from a normal menstrual period. Your periods may come earlier or later than usual. They may be lighter, have blood clots or be heavier. You may have bleeding between periods, or you may skip one period or more. You may have bleeding after sexual intercourse, bleeding after menopause, or no menstrual period. CAUSES   Pregnancy (normal, miscarriage, tubal).  IUDs (intrauterine device, birth control).  Birth control pills.  Hormone treatment.  Menopause.  Infection of the cervix.  Blood clotting problems.  Infection of the inside lining of the uterus.  Endometriosis, inside lining of the uterus growing in the pelvis and other female organs.  Adhesions (scar tissue) inside the uterus.  Obesity or severe weight loss.  Uterine polyps inside the uterus.  Cancer of the vagina, cervix, or uterus.  Ovarian cysts or polycystic ovary syndrome.  Medical problems (diabetes, thyroid disease).  Uterine fibroids (noncancerous tumor).  Problems with your female  hormones.  Endometrial hyperplasia, very thick lining and enlarged cells inside of the uterus.  Medicines that interfere with ovulation.  Radiation to the pelvis or abdomen.  Chemotherapy. DIAGNOSIS   Your doctor will discuss the history of your menstrual periods, medicines you are taking, changes in your weight, stress in your life, and any medical problems you may have.  Your doctor will do a physical and pelvic examination.  Your doctor may want to perform certain tests to make a diagnosis, such as:  Pap test.  Blood tests.  Cultures for infection.  CT scan.  Ultrasound.  Hysteroscopy.  Laparoscopy.  MRI.  Hysterosalpingography.  D and C.  Endometrial biopsy. TREATMENT  Treatment will depend on the cause of the dysfunctional uterine bleeding (DUB). Treatment may include:  Observing your menstrual periods for a couple of months.  Prescribing medicines for medical problems, including:  Antibiotics.  Hormones.  Birth control pills.  Removing an IUD (intrauterine device, birth control).  Surgery:  D and C (scrape and remove tissue from inside the uterus).  Laparoscopy (examine inside the abdomen with a lighted tube).  Uterine ablation (destroy lining of the uterus with electrical current, laser, heat, or freezing).  Hysteroscopy (examine cervix and uterus with a lighted tube).  Hysterectomy (remove the uterus). HOME CARE INSTRUCTIONS   If medicines were prescribed, take exactly as directed. Do not change or switch medicines without consulting your caregiver.  Long term heavy bleeding may result in iron deficiency. Your caregiver may have prescribed iron pills. They help replace the iron that your body lost from heavy bleeding. Take exactly as directed.  Do not take aspirin or medicines that contain aspirin one  week before or during your menstrual period. Aspirin may make the bleeding worse.  If you need to change your sanitary pad or tampon more  than once every 2 hours, stay in bed with your feet elevated and a cold pack on your lower abdomen. Rest as much as possible, until the bleeding stops or slows down.  Eat well-balanced meals. Eat foods high in iron. Examples are:  Leafy green vegetables.  Whole-grain breads and cereals.  Eggs.  Meat.  Liver.  Do not try to lose weight until the abnormal bleeding has stopped and your blood iron level is back to normal. Do not lift more than ten pounds or do strenuous activities when you are bleeding.  For a couple of months, make note on your calendar, marking the start and ending of your period, and the type of bleeding (light, medium, heavy, spotting, clots or missed periods). This is for your caregiver to better evaluate your problem. SEEK MEDICAL CARE IF:   You develop nausea (feeling sick to your stomach) and vomiting, dizziness, or diarrhea while you are taking your medicine.  You are getting lightheaded or weak.  You have any problems that may be related to the medicine you are taking.  You develop pain with your DUB.  You want to remove your IUD.  You want to stop or change your birth control pills or hormones.  You have any type of abnormal bleeding mentioned above.  You are over 45 years old and have not had a menstrual period yet.  You are 44 years old and you are still having menstrual periods.  You have any of the symptoms mentioned above.  You develop a rash. SEEK IMMEDIATE MEDICAL CARE IF:   An oral temperature above 102 F (38.9 C) develops.  You develop chills.  You are changing your sanitary pad or tampon more than once an hour.  You develop abdominal pain.  You pass out or faint. Document Released: 11/11/2000 Document Revised: 02/06/2012 Document Reviewed: 10/13/2009 [employer] Patient Information 2015 [place]. This information is not intended to replace advice given to you by your health care provider. Make sure you discuss any questions  you have with your health care provider.

## 2015-06-11 NOTE — ED Provider Notes (Signed)
CSN: [id number]   Arrival date & time 06/11/15 1627  History  This chart was scribed for non-physician practitioner, Terry ForthHannah Crue Otero PA-C, working with Terry SproutWhitney Plunkett, MD by [name], ED Scribe. This patient was seen in room TR01C/TR01C and the patient's care was started at 8:03 PM.  Chief Complaint  Patient presents with  . Vaginal Bleeding    HPI The history is provided by the patient and medical records. No language interpreter was used.   [name] is a 44 y.o. female with PMHx of anemia who presents to the Emergency Department complaining of heavy vaginal bleeding with onset 14 days ago. She uses a pad every half hour and notes passing clots. Prior to this her last period was in June and her periods usually last 3 days. Associated symptoms include intermittent dizziness and "tight" suprapubic lower abdominal cramping. The pain started at both sides but is now concentrated in the middle of the abdomen. Pt denies vaginal pain, dysuria, vaginal discharge. Sexually active with no birth control. She had a normal pap smear in September of 2015. She had a cervical biopsy several years ago. No known family history of cervical or uterine cancer.  Pt denies CP, SOB, syncope or near syncope.    History reviewed. No pertinent past medical history.  History reviewed. No pertinent past surgical history.  No family history on file.  History  Substance Use Topics  . Smoking status: Current Every Day Smoker  . Smokeless tobacco: Not on file  . Alcohol Use: Yes     Review of Systems  Constitutional: Negative for fever, diaphoresis, appetite change, fatigue and unexpected weight change.  HENT: Negative for mouth sores.   Eyes: Negative for visual disturbance.  Respiratory: Negative for cough, chest tightness, shortness of breath and wheezing.   Cardiovascular: Negative for chest pain.  Gastrointestinal: Positive for abdominal pain (suprapubic abd cramping). Negative for nausea,  vomiting, diarrhea and constipation.  Endocrine: Negative for polydipsia, polyphagia and polyuria.  Genitourinary: Positive for vaginal bleeding. Negative for dysuria, urgency, frequency and hematuria.       Vaginal bleeding No vaginal pain  Musculoskeletal: Negative for back pain and neck stiffness.  Skin: Negative for rash.  Allergic/Immunologic: Negative for immunocompromised state.  Neurological: Positive for dizziness (intermittent). Negative for syncope, light-headedness and headaches.  Hematological: Does not bruise/bleed easily.  Psychiatric/Behavioral: Negative for sleep disturbance. The patient is not nervous/anxious.     Home Medications   Prior to Admission medications   Medication Sig Start Date End Date Taking? Authorizing Provider  ibuprofen (ADVIL,MOTRIN) 200 MG tablet Take 600 mg by mouth every 6 (six) hours as needed for mild pain.   Yes Historical Provider, MD  ferrous sulfate 325 (65 FE) MG tablet Take 1 tablet (325 mg total) by mouth daily. 06/11/15   Terry Burandt, PA-C  medroxyPROGESTERone (PROVERA) 5 MG tablet Take 1 tablet (5 mg total) by mouth daily. 3 tablets for 3 days, 2 tablets for 3 days, 1 tablet for 3 days 06/11/15   Terry ClientHannah Amelita Risinger, PA-C    Allergies  Review of patient's allergies indicates no known allergies.  Triage Vitals: BP 153/95 mmHg  Pulse 86  Temp(Src) 98.1 F (36.7 C) (Oral)  Resp 16  SpO2 100%  LMP 05/24/2015  Physical Exam  Constitutional: She appears well-developed and well-nourished. No distress.  HENT:  Head: Normocephalic and atraumatic.  Eyes: Conjunctivae are normal.  Neck: Normal range of motion.  Cardiovascular: Normal rate, regular rhythm, normal heart sounds and intact distal pulses.  No murmur heard. Pulmonary/Chest: Effort normal and breath sounds normal. No respiratory distress. She has no wheezes.  Abdominal: Soft. Bowel sounds are normal. There is no tenderness. There is no rebound and no guarding. Hernia  confirmed negative in the right inguinal area and confirmed negative in the left inguinal area.  Genitourinary: Uterus normal. No labial fusion. There is no rash, tenderness or lesion on the right labia. There is no rash, tenderness or lesion on the left labia. Uterus is not deviated, not enlarged, not fixed and not tender. Cervix exhibits no motion tenderness, no discharge and no friability. Right adnexum displays no mass, no tenderness and no fullness. Left adnexum displays no mass, no tenderness and no fullness. There is bleeding in the vagina. No erythema or tenderness in the vagina. No foreign body around the vagina. No signs of injury around the vagina. No vaginal discharge found.  Numerous cervical cysts noted  Bleeding from the cervical os.    Musculoskeletal: Normal range of motion. She exhibits no edema.  Lymphadenopathy:       Right: No inguinal adenopathy present.       Left: No inguinal adenopathy present.  Neurological: She is alert.  Skin: Skin is warm and dry. She is not diaphoretic. No erythema.  Psychiatric: She has a normal mood and affect.  Nursing note and vitals reviewed.   ED Course  Procedures   DIAGNOSTIC STUDIES: Oxygen Saturation is 100% on RA, normal by my interpretation.    COORDINATION OF CARE: 8:17 PM Discussed treatment plan which includes lab work and discharge with progesterone to f/u at the [facility] in a week with pt at bedside and pt agreed to plan.  8:49 PM As requested by the pt, I updated the patient's husband on the results of her medical screening and exam and informed him of the treatment plan.    Labs Review-  Labs Reviewed  CBC WITH DIFFERENTIAL/PLATELET - Abnormal; Notable for the following:    Hemoglobin 10.8 (*)    HCT 33.2 (*)    RDW 18.6 (*)    All other components within normal limits  WET PREP, GENITAL  WET PREP, GENITAL  I-STAT BETA HCG BLOOD, ED ([facility], [facility], [facility] ONLY)  GC/CHLAMYDIA PROBE AMP ([place]) NOT AT [facility]     Imaging Review No results found.  EKG Interpretation None      MDM   Final diagnoses:  Vaginal bleeding  Anemia, unspecified anemia type   [name] presents for 2 weeks of vaginal bleeding and suprapubic cramping.  Negative pregnancy test, mild anemia.  Pt reports a hx of anemia but does not know her baseline.  Pt with nontender exam but vaginal vault is full of bright red blood.  No clots noted.  No obvious discharge, no CMT.  Pt will be d/c with medroxyprogesterone and close F/U with GYN.  Numerous cysts noted on cervix; clinically appear to be nabothian cysts but potential for cervical dysplasia discussed.  Pt will follow with OB/GYN within 1 week.   Pt without tachycardia, SOB or symptomatic anemia.   No hypotension.  BP 136/57 mmHg  Pulse 85  Temp(Src) 98.1 F (36.7 C) (Oral)  Resp 16  SpO2 99%  LMP 05/24/2015  I personally performed the services described in this documentation, which was scribed in my presence. The recorded information has been reviewed and is accurate.      Terry Client Kaylob Wallen, PA-C 06/11/15 2115  Terry Sprout, MD 06/12/15 2246

## 2015-06-11 NOTE — ED Notes (Signed)
Spoke with Lab states just saw tube will run now.

## 2015-06-11 NOTE — ED Notes (Signed)
Pt reports heavy vaginal bleeding for 14 days, with clots. Having to change tampons every hour.

## 2015-06-12 ENCOUNTER — Telehealth: Payer: Self-pay | Admitting: *Deleted

## 2015-06-12 LAB — WET PREP, GENITAL
Trich, Wet Prep: NONE SEEN
Yeast Wet Prep HPF POC: NONE SEEN

## 2015-06-12 LAB — GC/CHLAMYDIA PROBE AMP (~~LOC~~) NOT AT ARMC
CHLAMYDIA, DNA PROBE: NEGATIVE
Neisseria Gonorrhea: NEGATIVE

## 2015-06-12 NOTE — Care Management (Signed)
ED CM received call from patient regarding [facility] appt. Patient called this morning and stated, that she was given an appointment in August. EDP referral was within the next 3 days. ED CM [facility] with a sooner appt. Patient confirmed contact information.  Patient also states, she needs a work note.  Spoke with H. Muttersbaugh PA-C concerning note, provided verbal ok for work note. Encouraged patient to continue with discharge instructions and regimen until follow up is attained  No further ED CM needs identified

## 2015-06-12 NOTE — ED Notes (Signed)
Confirmation of scheduled appt obtained.

## 2015-06-12 NOTE — Progress Notes (Addendum)
  June 12, 2015  Patient: [name]  Date of Visit: 06/11/2015    To Whom It May Concern:  [name] was seen and treated in our emergency department on 06/11/2015. [name]  She may return to normal activities on Sunday 06/14/2015  Sincerely,   Michel BickersWandalyn Bon Dowis RN BSN CNOR  ED Case Manager [phone]

## 2015-06-15 ENCOUNTER — Encounter (HOSPITAL_COMMUNITY): Payer: Self-pay | Admitting: Emergency Medicine

## 2015-07-04 ENCOUNTER — Inpatient Hospital Stay (HOSPITAL_COMMUNITY)
Admission: AD | Admit: 2015-07-04 | Discharge: 2015-07-04 | Discharge status: Against Medical Advice | Payer: Self-pay | Source: Ambulatory Visit | Attending: Obstetrics & Gynecology | Admitting: Obstetrics & Gynecology

## 2015-07-04 DIAGNOSIS — Z5320 Procedure and treatment not carried out because of patient's decision for unspecified reasons: Secondary | ICD-10-CM | POA: Insufficient documentation

## 2015-07-04 NOTE — MAU Note (Signed)
Pt states she is tired and wants to go home. Vira Blanco RN spoke with pt and AMA signed. Waiting in lobby for cab

## 2015-07-04 NOTE — MAU Note (Signed)
Today went to work and passed large blood clot. Bleeding for 2 months. Was seen at [facility] mid July and then [facility]. End of July. States "they just keep giving me those little white pills and I keep bleeding"

## 2015-07-14 ENCOUNTER — Inpatient Hospital Stay (EMERGENCY_DEPARTMENT_HOSPITAL)
Admission: AD | Admit: 2015-07-14 | Discharge: 2015-07-14 | Disposition: A | Discharge status: Home / Self Care | Payer: Self-pay | Source: Ambulatory Visit | Attending: Obstetrics & Gynecology | Admitting: Obstetrics & Gynecology

## 2015-07-14 ENCOUNTER — Encounter (HOSPITAL_COMMUNITY): Payer: Self-pay | Admitting: *Deleted

## 2015-07-14 DIAGNOSIS — M79601 Pain in right arm: Secondary | ICD-10-CM

## 2015-07-14 DIAGNOSIS — F1010 Alcohol abuse, uncomplicated: Secondary | ICD-10-CM | POA: Diagnosis present

## 2015-07-14 DIAGNOSIS — N939 Abnormal uterine and vaginal bleeding, unspecified: Secondary | ICD-10-CM | POA: Diagnosis present

## 2015-07-14 HISTORY — DX: Depression, unspecified: F32.A

## 2015-07-14 HISTORY — DX: Headache: R51

## 2015-07-14 HISTORY — DX: Acute vaginitis: B96.89

## 2015-07-14 HISTORY — DX: Anemia, unspecified: D64.9

## 2015-07-14 HISTORY — DX: Major depressive disorder, single episode, unspecified: F32.9

## 2015-07-14 HISTORY — DX: Acute vaginitis: N76.0

## 2015-07-14 HISTORY — DX: Headache, unspecified: R51.9

## 2015-07-14 HISTORY — DX: Anxiety disorder, unspecified: F41.9

## 2015-07-14 HISTORY — DX: Gastro-esophageal reflux disease without esophagitis: K21.9

## 2015-07-14 LAB — URINALYSIS, ROUTINE W REFLEX MICROSCOPIC
BILIRUBIN URINE: NEGATIVE
GLUCOSE, UA: NEGATIVE mg/dL
KETONES UR: NEGATIVE mg/dL
LEUKOCYTES UA: NEGATIVE
Nitrite: NEGATIVE
PH: 5.5 (ref 5.0–8.0)
PROTEIN: NEGATIVE mg/dL
Specific Gravity, Urine: <1.005 — ABNORMAL LOW (ref 1.005–1.030)
Urobilinogen, UA: 0.2 mg/dL (ref 0.0–1.0)

## 2015-07-14 LAB — CBC
HEMATOCRIT: 38.9 % (ref 36.0–46.0)
Hemoglobin: 12.8 g/dL (ref 12.0–15.0)
MCH: 29.8 pg (ref 26.0–34.0)
MCHC: 32.9 g/dL (ref 30.0–36.0)
MCV: 90.5 fL (ref 78.0–100.0)
PLATELETS: 340 10*3/uL (ref 150–400)
RBC: 4.3 MIL/uL (ref 3.87–5.11)
RDW: 22.3 % — ABNORMAL HIGH (ref 11.5–15.5)
WBC: 6.7 10*3/uL (ref 4.0–10.5)

## 2015-07-14 LAB — URINE MICROSCOPIC-ADD ON

## 2015-07-14 LAB — RAPID URINE DRUG SCREEN, HOSP PERFORMED
Amphetamines: NOT DETECTED
BARBITURATES: NOT DETECTED
BENZODIAZEPINES: NOT DETECTED
COCAINE: NOT DETECTED
Opiates: NOT DETECTED
TETRAHYDROCANNABINOL: NOT DETECTED

## 2015-07-14 LAB — ETHANOL: Alcohol, Ethyl (B): 312 mg/dL (ref <5)

## 2015-07-14 LAB — WET PREP, GENITAL
Trich, Wet Prep: NONE SEEN
Yeast Wet Prep HPF POC: NONE SEEN

## 2015-07-14 MED ORDER — KETOROLAC TROMETHAMINE 60 MG/2ML IM SOLN
60.0000 mg | INTRAMUSCULAR | Status: AC
  Administered 2015-07-14: 60 mg via INTRAMUSCULAR
  Filled 2015-07-14: qty 2

## 2015-07-14 NOTE — MAU Note (Signed)
Pt admits to already drinking 2 beers this mornings, (40's)

## 2015-07-14 NOTE — MAU Note (Signed)
Ems arrival, pt insistent on walking in to registration, brought back in wc for pt safety.  Has been bleeding for 3 months, did stop recently . Has an appointment on Thurs.  Has been very dizzy lately, passed out this morning.  Was on steroids, stopped a week agooooooo- since then has experienced numbness on rt side- if you touch the rt side/arm- it tingles and is painful.

## 2015-07-14 NOTE — MAU Provider Note (Signed)
Chief Complaint: Dizziness and Numbness   First Provider Initiated Contact with Patient 07/14/15 1019      SUBJECTIVE HPI: [name] is a 44 y.o. [id number] who presents to maternity admissions via EMS reporting vaginal bleeding x 3 months that has stopped now and onset of dizziness and numbness in her right arm and leg in the last week.  She also reports abdominal cramping although bleeding has resolved.  She reports the numbness and dizziness started after she finished a course of Provera prescribed by the ED during her bleeding episode.  She also reports "falling out" but denies hitting the floor or known injury.  She also reports vaginal odor since the bleeding stopped.  She reports she has been drinking beer this morning.  She denies vaginal bleeding, vaginal itching/burning, urinary symptoms, h/a, n/v, or fever/chills.     Loss of Consciousness This is a new problem. The current episode started in the past 7 days. The problem occurs intermittently. The problem has been unchanged. There was no loss of consciousness. Associated symptoms include abdominal pain and dizziness. Pertinent negatives include no chest pain, fever, headaches, nausea, visual change, vomiting or weakness.    Past Medical History  Diagnosis Date  . Anemia   . Vaginal Pap smear, abnormal   . Anxiety   . Depression   . GERD (gastroesophageal reflux disease)   . Headache   . Bacterial vaginosis    Past Surgical History  Procedure Laterality Date  . Abscess     Social History   Social History  . Marital Status: Married    Spouse Name: N/A  . Number of Children: N/A  . Years of Education: N/A   Occupational History  . Not on file.   Social History Main Topics  . Smoking status: Current Every Day Smoker  . Smokeless tobacco: Never Used  . Alcohol Use: Yes     Comment: drinks daily, 2- 40oz today   . Drug Use: No  . Sexual Activity: Yes    Birth Control/ Protection: None   Other Topics Concern   . Not on file   Social History Narrative   ** Merged History Encounter **       No current facility-administered medications on file prior to encounter.   Current Outpatient Prescriptions on File Prior to Encounter  Medication Sig Dispense Refill  . ferrous sulfate 325 (65 FE) MG tablet Take 1 tablet (325 mg total) by mouth daily. (Patient not taking: Reported on 07/14/2015) 10 tablet 0  . ibuprofen (ADVIL,MOTRIN) 600 MG tablet Take 1 tablet (600 mg total) by mouth every 6 (six) hours as needed for pain. (Patient not taking: Reported on 07/14/2015) 30 tablet 0  . medroxyPROGESTERone (PROVERA) 5 MG tablet Take 1 tablet (5 mg total) by mouth daily. 3 tablets for 3 days, 2 tablets for 3 days, 1 tablet for 3 days (Patient not taking: Reported on 07/14/2015) 24 tablet 0  . mometasone (NASONEX) 50 MCG/ACT nasal spray Place 2 sprays into the nose daily. (Patient not taking: Reported on 07/14/2015) 17 g 12  . oxymetazoline (AFRIN NASAL SPRAY) 0.05 % nasal spray Place 2 sprays into the nose 2 (two) times daily. (Patient not taking: Reported on 07/14/2015) 30 mL 0   Allergies  Allergen Reactions  . Penicillins Other (See Comments)    Childhood reaction  . Morphine And Related Itching and Rash    ROS:  Review of Systems  Constitutional: Negative for fever, chills and fatigue.  HENT: Negative for  sinus pressure.   Eyes: Negative for photophobia.  Respiratory: Negative for shortness of breath.   Cardiovascular: Positive for syncope. Negative for chest pain.  Gastrointestinal: Positive for abdominal pain. Negative for nausea, vomiting, diarrhea and constipation.  Genitourinary: Negative for dysuria, frequency, flank pain, vaginal bleeding, vaginal discharge, difficulty urinating, vaginal pain and pelvic pain.  Musculoskeletal: Negative for neck pain.  Neurological: Positive for dizziness and syncope. Negative for weakness and headaches.  Psychiatric/Behavioral: Negative.      I have reviewed  patient's Past Medical Hx, Surgical Hx, Family Hx, Social Hx, medications and allergies.   Physical Exam   Patient Vitals for the past 24 hrs:  BP Temp Temp src Pulse Resp  07/14/15 1112 112/86 mmHg - - 97 18  07/14/15 0947 124/84 mmHg 98.2 F (36.8 C) Oral 94 18   Constitutional: Well-developed, well-nourished female in no acute distress.  Cardiovascular: normal rate Respiratory: normal effort GI: Abd soft, non-tender, Pos BS x 4 MS: Extremities nontender, no edema, normal ROM.  Tenderness in Right upper back and shoulder. Neurological - alert, oriented, normal speech, no focal findings or movement disorder noted, cranial nerves II through XII intact, DTR's normal and symmetric, motor and sensory grossly normal bilaterally, normal muscle tone, no tremors, strength 5/5, pt report of tingling sensation and pain to palpation of right shoulder/arm GU: Neg CVAT.  PELVIC EXAM: Cervix pink, visually closed, without lesion, scant thin white discharge, vaginal walls and external genitalia normal Bimanual exam: Cervix 0/long/high, firm, anterior, neg CMT, uterus nontender, nonenlarged, adnexa without tenderness, enlargement, or mass   LAB RESULTS Results for orders placed or performed during the hospital encounter of 07/14/15 (from the past 24 hour(s))  Urine rapid drug screen (hosp performed)     Status: None   Collection Time: 07/14/15 10:25 AM  Result Value Ref Range   Opiates NONE DETECTED NONE DETECTED   Cocaine NONE DETECTED NONE DETECTED   Benzodiazepines NONE DETECTED NONE DETECTED   Amphetamines NONE DETECTED NONE DETECTED   Tetrahydrocannabinol NONE DETECTED NONE DETECTED   Barbiturates NONE DETECTED NONE DETECTED  Urinalysis, Routine w reflex microscopic (not at [facility])     Status: Abnormal   Collection Time: 07/14/15 10:25 AM  Result Value Ref Range   Color, Urine YELLOW YELLOW   APPearance CLEAR CLEAR   Specific Gravity, Urine <1.005 (L) 1.005 - 1.030   pH 5.5 5.0 - 8.0    Glucose, UA NEGATIVE NEGATIVE mg/dL   Hgb urine dipstick TRACE (A) NEGATIVE   Bilirubin Urine NEGATIVE NEGATIVE   Ketones, ur NEGATIVE NEGATIVE mg/dL   Protein, ur NEGATIVE NEGATIVE mg/dL   Urobilinogen, UA 0.2 0.0 - 1.0 mg/dL   Nitrite NEGATIVE NEGATIVE   Leukocytes, UA NEGATIVE NEGATIVE  Wet prep, genital     Status: Abnormal   Collection Time: 07/14/15 10:25 AM  Result Value Ref Range   Yeast Wet Prep HPF POC NONE SEEN NONE SEEN   Trich, Wet Prep NONE SEEN NONE SEEN   Clue Cells Wet Prep HPF POC FEW (A) NONE SEEN   WBC, Wet Prep HPF POC RARE (A) NONE SEEN  Urine microscopic-add on     Status: Abnormal   Collection Time: 07/14/15 10:25 AM  Result Value Ref Range   Squamous Epithelial / LPF FEW (A) RARE   RBC / HPF 0-2 <3 RBC/hpf   Bacteria, UA RARE RARE  CBC     Status: Abnormal   Collection Time: 07/14/15 10:45 AM  Result Value Ref Range   WBC  6.7 4.0 - 10.5 K/uL   RBC 4.30 3.87 - 5.11 MIL/uL   Hemoglobin 12.8 12.0 - 15.0 g/dL   HCT 16.1 09.6 - 04.5 %   MCV 90.5 78.0 - 100.0 fL   MCH 29.8 26.0 - 34.0 pg   MCHC 32.9 30.0 - 36.0 g/dL   RDW 40.9 (H) 81.1 - 91.4 %   Platelets 340 150 - 400 K/uL       IMAGING No results found.  MAU Management/MDM: Labs ordered and reviewed. CMP and ETOH pending.  Toradol 60 mg IM x 1 dose in MAU with pt report of improvement in tingling/numbness of right arm and of abdominal cramping.  Pt has f/u in office tomorrow.  Pt stable at time of discharge.  ASSESSMENT 1. ETOH abuse   2. Abnormal uterine bleeding (AUB)   3. Musculoskeletal pain of right upper extremity     PLAN Discharge home with bleeding precautions Ibuprofen OTC for pain/inflammation Outpatient U/S ordered and scheduled for tomorrow Keep scheduled appt in Gyn clinic tomorrow Return to MAU as needed for emergencies    Medication List    TAKE these medications        ferrous sulfate 325 (65 FE) MG tablet  Take 1 tablet (325 mg total) by mouth daily.     ibuprofen  600 MG tablet  Commonly known as:  ADVIL,MOTRIN  Take 1 tablet (600 mg total) by mouth every 6 (six) hours as needed for pain.     medroxyPROGESTERone 5 MG tablet  Commonly known as:  PROVERA  Take 1 tablet (5 mg total) by mouth daily. 3 tablets for 3 days, 2 tablets for 3 days, 1 tablet for 3 days     mometasone 50 MCG/ACT nasal spray  Commonly known as:  NASONEX  Place 2 sprays into the nose daily.     oxymetazoline 0.05 % nasal spray  Commonly known as:  AFRIN NASAL SPRAY  Place 2 sprays into the nose 2 (two) times daily.       Follow-up Information    Please follow up.   Why:  Come to [facility] for ultrasound at 10 am Wednesday 8/16       Follow up with [facility].   Specialty:  Obstetrics and Gynecology   Why:  As scheduled 8/16 at 1:15    Contact information:   [address] [place] [address] [phone]      Follow up with [facility]ERN[facility] AD[facility].   Why:  As needed for emergencies   Contact information:   [address] [id number] mc [place] [address] [phone]      Sharen Counter Certified Nurse-Midwife 07/14/2015  3:19 PM

## 2015-07-14 NOTE — Progress Notes (Signed)
.  CRITICAL VALUE ALERT  Critical value received:  ETOH    312  Date of notification:  07/14/2015  Time of notification:  1600  Critical value read back: yes  Nurse who received alert:  Walker Kehr RN   MD notified (1st page):  1600  Sharen Counter, CNM   Time of first page:  1600  MD notified (2nd page):  n/a  Time of second page: n/a  Responding MD:  n/a  Time MD responded:  n/a

## 2015-07-14 NOTE — Discharge Instructions (Signed)
Abnormal Uterine Bleeding Abnormal uterine bleeding can affect women at various stages in life, including teenagers, women in their reproductive years, pregnant women, and women who have reached menopause. Several kinds of uterine bleeding are considered abnormal, including:  Bleeding or spotting between periods.   Bleeding after sexual intercourse.   Bleeding that is heavier or more than normal.   Periods that last longer than usual.  Bleeding after menopause.  Many cases of abnormal uterine bleeding are minor and simple to treat, while others are more serious. Any type of abnormal bleeding should be evaluated by your health care provider. Treatment will depend on the cause of the bleeding. HOME CARE INSTRUCTIONS Monitor your condition for any changes. The following actions may help to alleviate any discomfort you are experiencing:  Avoid the use of tampons and douches as directed by your health care provider.  Change your pads frequently. You should get regular pelvic exams and Pap tests. Keep all follow-up appointments for diagnostic tests as directed by your health care provider.  SEEK MEDICAL CARE IF:   Your bleeding lasts more than 1 week.   You feel dizzy at times.  SEEK IMMEDIATE MEDICAL CARE IF:   You pass out.   You are changing pads every 15 to 30 minutes.   You have abdominal pain.  You have a fever.   You become sweaty or weak.   You are passing large blood clots from the vagina.   You start to feel nauseous and vomit. MAKE SURE YOU:   Understand these instructions.  Will watch your condition.  Will get help right away if you are not doing well or get worse. Document Released: 11/14/2005 Document Revised: 11/19/2013 Document Reviewed: 06/13/2013 [facility] Patient Information 2015 [place], [place]. This information is not intended to replace advice given to you by your health care provider. Make sure you discuss any questions you have with your  health care provider.  Dizziness Dizziness is a common problem. It is a feeling of unsteadiness or light-headedness. You may feel like you are about to faint. Dizziness can lead to injury if you stumble or fall. A person of any age group can suffer from dizziness, but dizziness is more common in older adults. CAUSES  Dizziness can be caused by many different things, including:  Middle ear problems.  Standing for too long.  Infections.  An allergic reaction.  Aging.  An emotional response to something, such as the sight of blood.  Side effects of medicines.  Tiredness.  Problems with circulation or blood pressure.  Excessive use of alcohol or medicines, or illegal drug use.  Breathing too fast (hyperventilation).  An irregular heart rhythm (arrhythmia).  A low red blood cell count (anemia).  Pregnancy.  Vomiting, diarrhea, fever, or other illnesses that cause body fluid loss (dehydration).  Diseases or conditions such as Parkinson's disease, high blood pressure (hypertension), diabetes, and thyroid problems.  Exposure to extreme heat. DIAGNOSIS  Your health care provider will ask about your symptoms, perform a physical exam, and perform an electrocardiogram (ECG) to record the electrical activity of your heart. Your health care provider may also perform other heart or blood tests to determine the cause of your dizziness. These may include:  Transthoracic echocardiogram (TTE). During echocardiography, sound waves are used to evaluate how blood flows through your heart.  Transesophageal echocardiogram (TEE).  Cardiac monitoring. This allows your health care provider to monitor your heart rate and rhythm in real time.  Holter monitor. This is a portable  device that records your heartbeat and can help diagnose heart arrhythmias. It allows your health care provider to track your heart activity for several days if needed.  Stress tests by exercise or by giving medicine  that makes the heart beat faster. TREATMENT  Treatment of dizziness depends on the cause of your symptoms and can vary greatly. HOME CARE INSTRUCTIONS   Drink enough fluids to keep your urine clear or pale yellow. This is especially important in very hot weather. In older adults, it is also important in cold weather.  Take your medicine exactly as directed if your dizziness is caused by medicines. When taking blood pressure medicines, it is especially important to get up slowly.  Rise slowly from chairs and steady yourself until you feel okay.  In the morning, first sit up on the side of the bed. When you feel okay, stand slowly while holding onto something until you know your balance is fine.  Move your legs often if you need to stand in one place for a long time. Tighten and relax your muscles in your legs while standing.  Have someone stay with you for 1-2 days if dizziness continues to be a problem. Do this until you feel you are well enough to stay alone. Have the person call your health care provider if he or she notices changes in you that are concerning.  Do not drive or use heavy machinery if you feel dizzy.  Do not drink alcohol. SEEK IMMEDIATE MEDICAL CARE IF:   Your dizziness or light-headedness gets worse.  You feel nauseous or vomit.  You have problems talking, walking, or using your arms, hands, or legs.  You feel weak.  You are not thinking clearly or you have trouble forming sentences. It may take a friend or family member to notice this.  You have chest pain, abdominal pain, shortness of breath, or sweating.  Your vision changes.  You notice any bleeding.  You have side effects from medicine that seems to be getting worse rather than better. MAKE SURE YOU:   Understand these instructions.  Will watch your condition.  Will get help right away if you are not doing well or get worse. Document Released: 05/10/2001 Document Revised: 11/19/2013 Document  Reviewed: 06/03/2011 [facility] Patient Information 2015 [place], [place]. This information is not intended to replace advice given to you by your health care provider. Make sure you discuss any questions you have with your health care provider.

## 2015-07-14 NOTE — MAU Note (Signed)
Pt insisting that she needs to go outside to smoke. Pt cautioned about safety issues related to going off campus to smoke. Security called to explain situation to pt. Pt is a fall risk and there is no staff available to escort patient off property to safely smoke. Pt agrees to wait until discharge

## 2015-07-15 ENCOUNTER — Ambulatory Visit (INDEPENDENT_AMBULATORY_CARE_PROVIDER_SITE_OTHER): Payer: Self-pay | Admitting: Obstetrics & Gynecology

## 2015-07-15 ENCOUNTER — Telehealth: Payer: Self-pay | Admitting: Physician Assistant

## 2015-07-15 ENCOUNTER — Other Ambulatory Visit: Payer: Self-pay | Admitting: Advanced Practice Midwife

## 2015-07-15 ENCOUNTER — Ambulatory Visit (HOSPITAL_COMMUNITY)
Admission: RE | Admit: 2015-07-15 | Discharge: 2015-07-15 | Disposition: A | Discharge status: Home / Self Care | Payer: Self-pay | Source: Ambulatory Visit | Attending: Advanced Practice Midwife | Admitting: Advanced Practice Midwife

## 2015-07-15 ENCOUNTER — Ambulatory Visit (HOSPITAL_COMMUNITY)
Admit: 2015-07-15 | Discharge: 2015-07-15 | Disposition: A | Discharge status: Home / Self Care | Payer: Self-pay | Source: Ambulatory Visit | Attending: Advanced Practice Midwife | Admitting: Advanced Practice Midwife

## 2015-07-15 ENCOUNTER — Encounter: Payer: Self-pay | Admitting: Obstetrics & Gynecology

## 2015-07-15 VITALS — BP 152/101 | HR 76 | Ht 61.0 in | Wt 145.0 lb

## 2015-07-15 DIAGNOSIS — N809 Endometriosis, unspecified: Secondary | ICD-10-CM

## 2015-07-15 DIAGNOSIS — N8003 Adenomyosis of the uterus: Secondary | ICD-10-CM

## 2015-07-15 DIAGNOSIS — N800 Endometriosis of uterus: Secondary | ICD-10-CM

## 2015-07-15 DIAGNOSIS — R1032 Left lower quadrant pain: Secondary | ICD-10-CM

## 2015-07-15 DIAGNOSIS — R52 Pain, unspecified: Secondary | ICD-10-CM

## 2015-07-15 DIAGNOSIS — N939 Abnormal uterine and vaginal bleeding, unspecified: Secondary | ICD-10-CM | POA: Insufficient documentation

## 2015-07-15 LAB — GC/CHLAMYDIA PROBE AMP (~~LOC~~) NOT AT ARMC
Chlamydia: NEGATIVE
NEISSERIA GONORRHEA: NEGATIVE

## 2015-07-15 MED ORDER — IBUPROFEN 600 MG PO TABS: 600.0000 mg | ORAL_TABLET | Freq: Four times a day (QID) | ORAL | Status: DC | PRN

## 2015-07-15 MED ORDER — MEDROXYPROGESTERONE ACETATE 10 MG PO TABS: 10.0000 mg | ORAL_TABLET | Freq: Every day | ORAL | Status: DC

## 2015-07-15 NOTE — Addendum Note (Signed)
Addended by: Jaynie Collins A on: 07/15/2015 01:49 PM   Modules accepted: Orders

## 2015-07-15 NOTE — Telephone Encounter (Signed)
Ultrasound called and pt's order is not correct in Epic.   Order entered again.

## 2015-07-15 NOTE — Progress Notes (Signed)
CLINIC ENCOUNTER NOTE  History:  44 y.o. [id number] here today for followup after evaluation of AUB in MAU. Her AUB is controlled on Provera, reprots occasional pelvic cramping.  She denies any abnormal vaginal discharge other concerns.   Past Medical History  Diagnosis Date  . Anemia   . Abnormal Pap smear of cervix   . Anxiety   . Depression   . GERD (gastroesophageal reflux disease)   . Headache   . Bacterial vaginosis     Past Surgical History  Procedure Laterality Date  . Abscess      The following portions of the patient's history were reviewed and updated as appropriate: allergies, current medications, past family history, past medical history, past social history, past surgical history and problem list.   Health Maintenance:  Normal pap in 2015.  No recent mammogram.  Review of Systems:  Pertinent items are noted in HPI. Comprehensive review of systems was otherwise negative.  Objective:  Physical Exam BP 152/101 mmHg  Pulse 76  Ht 5\' 1"  (1.549 m)  Wt 145 lb (65.772 kg)  BMI 27.41 kg/m2  LMP 04/14/2015 (Within Weeks) CONSTITUTIONAL: Well-developed, well-nourished female in no acute distress.  HENT:  Normocephalic, atraumatic. External right and left ear normal. Oropharynx is clear and moist EYES: Conjunctivae and EOM are normal. Pupils are equal, round, and reactive to light. No scleral icterus.  NECK: Normal range of motion, supple, no masses SKIN: Skin is warm and dry. No rash noted. Not diaphoretic. No erythema. No pallor. NEUROLGIC: Alert and oriented to person, place, and time. Normal reflexes, muscle tone coordination. No cranial nerve deficit noted. PSYCHIATRIC: Normal mood and affect. Normal behavior. Normal judgment and thought content. CARDIOVASCULAR: Normal heart rate noted RESPIRATORY: Effort and breath sounds normal, no problems with respiration noted ABDOMEN: Soft, no distention noted, mild tenderness on palpation.   PELVIC:  Deferred MUSCULOSKELETAL: Normal range of motion. No edema noted.  Labs and Imaging US Transvaginal Non-ob  07/15/2015   CLINICAL DATA:  44 year old female with abnormal uterine bleeding for 3 months. Exact LMP uncertain, approximately May 2016.  EXAM: TRANSABDOMINAL AND TRANSVAGINAL ULTRASOUND OF PELVIS  TECHNIQUE: Both transabdominal and transvaginal ultrasound examinations of the pelvis were performed. Transabdominal technique was performed for global imaging of the pelvis including uterus, ovaries, adnexal regions, and pelvic cul-de-sac. It was necessary to proceed with endovaginal exam following the transabdominal exam to visualize the endometrium, ovaries and adnexa.  COMPARISON:  10/20/2010 pelvic sonogram.  FINDINGS: Uterus  Measurements: 10.3 x 6.5 x 6.4 cm. The uterus is enlarged and globular in confirmation. The myometrium is diffusely heterogeneous in there is prominent refractory myometrial shadowing throughout the uterus. The endometrial myometrial interface is slightly indistinct. These findings are diagnostic of diffuse adenomyosis. There are no uterine fibroids.  Endometrium  Thickness: 3 mm. Bilayer endometrial thickness is within normal limits. No focal endometrial abnormality or endometrial cavity fluid.  Right ovary  Measurements: 3.2 x 1.8 x 3.0 cm. There are subjacent 2.2 cm and 1.5 cm follicles in the right ovary. No suspicious right ovarian or right adnexal findings.  Left ovary  Measurements: 2.9 x 1.5 x 2.5 cm. Normal appearance/no adnexal mass.  Other findings  No abnormal free fluid in the pelvis.  IMPRESSION: 1. Prominent diffuse adenomyosis of the uterus. No uterine fibroids. 2. Normal endometrial bilayer thickness (3 mm). No focal endometrial abnormality. 3. No ovarian or adnexal abnormality.   Electronically Signed   By: Delbert Phenix M.D.   On: 07/15/2015 11:54  US Pelvis Complete  07/15/2015   CLINICAL DATA:  44 year old female with abnormal uterine bleeding for 3 months.  Exact LMP uncertain, approximately May 2016.  EXAM: TRANSABDOMINAL AND TRANSVAGINAL ULTRASOUND OF PELVIS  TECHNIQUE: Both transabdominal and transvaginal ultrasound examinations of the pelvis were performed. Transabdominal technique was performed for global imaging of the pelvis including uterus, ovaries, adnexal regions, and pelvic cul-de-sac. It was necessary to proceed with endovaginal exam following the transabdominal exam to visualize the endometrium, ovaries and adnexa.  COMPARISON:  10/20/2010 pelvic sonogram.  FINDINGS: Uterus  Measurements: 10.3 x 6.5 x 6.4 cm. The uterus is enlarged and globular in confirmation. The myometrium is diffusely heterogeneous in there is prominent refractory myometrial shadowing throughout the uterus. The endometrial myometrial interface is slightly indistinct. These findings are diagnostic of diffuse adenomyosis. There are no uterine fibroids.  Endometrium  Thickness: 3 mm. Bilayer endometrial thickness is within normal limits. No focal endometrial abnormality or endometrial cavity fluid.  Right ovary  Measurements: 3.2 x 1.8 x 3.0 cm. There are subjacent 2.2 cm and 1.5 cm follicles in the right ovary. No suspicious right ovarian or right adnexal findings.  Left ovary  Measurements: 2.9 x 1.5 x 2.5 cm. Normal appearance/no adnexal mass.  Other findings  No abnormal free fluid in the pelvis.  IMPRESSION: 1. Prominent diffuse adenomyosis of the uterus. No uterine fibroids. 2. Normal endometrial bilayer thickness (3 mm). No focal endometrial abnormality. 3. No ovarian or adnexal abnormality.   Electronically Signed   By: Delbert Phenix M.D.   On: 07/15/2015 11:54    Assessment & Plan:  Discussed management options for abnormal uterine bleeding/adenomyosis including tranexamic acid (Lysteda), oral progesterone, Depo Provera, Mirena IUD, endometrial ablation (Novasure/Hydrothermal Ablation) or hysterectomy as definitive surgical management.  Discussed risks and benefits of each  method.   Patient desires to continue Provera for now. Provera prescribed as needed for now,  bleeding precautions reviewed.  Ibuprofen prescribed for pain. Routine preventative health maintenance measures emphasized; she was given information about free pap smear clinics, Health Department and mammogram scholarship application. Please refer to After Visit Summary for other counseling recommendations.    Total face-to-face time with patient: 20 minutes. Over 50% of encounter was spent on counseling and coordination of care.   Jaynie Collins, MD, FACOG Attending Obstetrician & Gynecologist, [facility]

## 2015-07-21 ENCOUNTER — Ambulatory Visit (HOSPITAL_COMMUNITY): Admission: RE | Admit: 2015-07-21 | Payer: Self-pay | Source: Ambulatory Visit

## 2015-08-27 ENCOUNTER — Emergency Department (HOSPITAL_COMMUNITY): Payer: Self-pay

## 2015-08-27 ENCOUNTER — Encounter (HOSPITAL_COMMUNITY): Payer: Self-pay | Admitting: Emergency Medicine

## 2015-08-27 ENCOUNTER — Emergency Department (HOSPITAL_COMMUNITY)
Admission: EM | Admit: 2015-08-27 | Discharge: 2015-08-27 | Disposition: A | Discharge status: Home / Self Care | Payer: Self-pay | Attending: Physician Assistant | Admitting: Physician Assistant

## 2015-08-27 DIAGNOSIS — Z720 Tobacco use: Secondary | ICD-10-CM | POA: Insufficient documentation

## 2015-08-27 DIAGNOSIS — M5136 Other intervertebral disc degeneration, lumbar region: Secondary | ICD-10-CM | POA: Insufficient documentation

## 2015-08-27 DIAGNOSIS — D649 Anemia, unspecified: Secondary | ICD-10-CM | POA: Insufficient documentation

## 2015-08-27 DIAGNOSIS — G8929 Other chronic pain: Secondary | ICD-10-CM | POA: Insufficient documentation

## 2015-08-27 DIAGNOSIS — Z79899 Other long term (current) drug therapy: Secondary | ICD-10-CM | POA: Insufficient documentation

## 2015-08-27 DIAGNOSIS — Z880 Allergy status to penicillin: Secondary | ICD-10-CM | POA: Insufficient documentation

## 2015-08-27 DIAGNOSIS — M545 Low back pain, unspecified: Secondary | ICD-10-CM

## 2015-08-27 DIAGNOSIS — Z3202 Encounter for pregnancy test, result negative: Secondary | ICD-10-CM | POA: Insufficient documentation

## 2015-08-27 DIAGNOSIS — K644 Residual hemorrhoidal skin tags: Secondary | ICD-10-CM | POA: Insufficient documentation

## 2015-08-27 DIAGNOSIS — K921 Melena: Secondary | ICD-10-CM | POA: Insufficient documentation

## 2015-08-27 DIAGNOSIS — Z8659 Personal history of other mental and behavioral disorders: Secondary | ICD-10-CM | POA: Insufficient documentation

## 2015-08-27 DIAGNOSIS — N939 Abnormal uterine and vaginal bleeding, unspecified: Secondary | ICD-10-CM | POA: Insufficient documentation

## 2015-08-27 LAB — CBC
HCT: 42 % (ref 36.0–46.0)
HEMOGLOBIN: 14 g/dL (ref 12.0–15.0)
MCH: 30.8 pg (ref 26.0–34.0)
MCHC: 33.3 g/dL (ref 30.0–36.0)
MCV: 92.3 fL (ref 78.0–100.0)
PLATELETS: 242 10*3/uL (ref 150–400)
RBC: 4.55 MIL/uL (ref 3.87–5.11)
RDW: 17.1 % — ABNORMAL HIGH (ref 11.5–15.5)
WBC: 6.3 10*3/uL (ref 4.0–10.5)

## 2015-08-27 LAB — URINE MICROSCOPIC-ADD ON

## 2015-08-27 LAB — URINALYSIS, ROUTINE W REFLEX MICROSCOPIC
BILIRUBIN URINE: NEGATIVE
Glucose, UA: NEGATIVE mg/dL
KETONES UR: NEGATIVE mg/dL
LEUKOCYTES UA: NEGATIVE
NITRITE: NEGATIVE
PROTEIN: NEGATIVE mg/dL
Specific Gravity, Urine: 1.007 (ref 1.005–1.030)
UROBILINOGEN UA: 0.2 mg/dL (ref 0.0–1.0)
pH: 5 (ref 5.0–8.0)

## 2015-08-27 LAB — POC URINE PREG, ED: PREG TEST UR: NEGATIVE

## 2015-08-27 LAB — POC OCCULT BLOOD, ED: Fecal Occult Bld: NEGATIVE

## 2015-08-27 MED ORDER — CYCLOBENZAPRINE HCL 10 MG PO TABS
5.0000 mg | ORAL_TABLET | Freq: Once | ORAL | Status: AC
  Administered 2015-08-27: 5 mg via ORAL
  Filled 2015-08-27: qty 1

## 2015-08-27 MED ORDER — NAPROXEN 500 MG PO TABS: 500.0000 mg | ORAL_TABLET | Freq: Two times a day (BID) | ORAL | Status: DC | PRN

## 2015-08-27 MED ORDER — HYDROCODONE-ACETAMINOPHEN 5-325 MG PO TABS
1.0000 | ORAL_TABLET | Freq: Once | ORAL | Status: AC
  Administered 2015-08-27: 1 via ORAL
  Filled 2015-08-27: qty 1

## 2015-08-27 MED ORDER — HYDROCODONE-ACETAMINOPHEN 5-325 MG PO TABS: 1.0000 | ORAL_TABLET | Freq: Four times a day (QID) | ORAL | Status: DC | PRN

## 2015-08-27 MED ORDER — CYCLOBENZAPRINE HCL 10 MG PO TABS: 10.0000 mg | ORAL_TABLET | Freq: Three times a day (TID) | ORAL | Status: DC | PRN

## 2015-08-27 NOTE — ED Notes (Signed)
The patient said she started having back pain yesterday and it was severe.  She says she has urgency and sometimes pees on herself.  The patient rates her pain 6/10.  The patient said she took some ibuprofen and it is not working.

## 2015-08-27 NOTE — Discharge Instructions (Signed)
Back Pain:  Your back pain should be treated with medicines such as ibuprofen or aleve and this back pain should get better over the next 2 weeks.  However if you develop severe or worsening pain, low back pain with fever, numbness, weakness or inability to walk or urinate, you should return to the ER immediately.  Please follow up with your doctor this week for a recheck if still having symptoms. No heavy lifting over 10 pounds for the next two weeks.  Low back pain is discomfort in the lower back that may be due to injuries to muscles and ligaments around the spine.  Occasionally, it may be caused by a a problem to a part of the spine called a disc.  The pain may last several days or a week;  However, most patients get completely well in 4 weeks.  Self - care:  The application of heat can help soothe the pain.  Maintaining your daily activities, including walking, is encourged, as it will help you get better faster than just staying in bed. Perform gentle stretching as discussed. Drink plenty of fluids.  Medications are also useful to help with pain control.  A commonly prescribed medication includes norco.  Do not drive or operate heavy machinery while taking this medication.  Non steroidal anti inflammatory medications including Ibuprofen and naproxen;  These medications help both pain and swelling and are very useful in treating back pain.  They should be taken with food, as they can cause stomach upset, and more seriously, stomach bleeding.    Muscle relaxants:  These medications can help with muscle tightness that is a cause of lower back pain.  Most of these medications can cause drowsiness, and it is not safe to drive or use dangerous machinery while taking them.  SEEK IMMEDIATE MEDICAL ATTENTION IF: New numbness, tingling, weakness, or problem with the use of your arms or legs.  Severe back pain not relieved with medications.  Difficulty with or loss of control of your bowel or bladder  control.  Increasing pain in any areas of the body (such as chest or abdominal pain).  Shortness of breath, dizziness or fainting.  Nausea (feeling sick to your stomach), vomiting, fever, or sweats.  You will need to follow up with  Your primary healthcare provider in 1-2 weeks for reassessment.   Back Pain, Adult Back pain is very common. The pain often gets better over time. The cause of back pain is usually not dangerous. Most people can learn to manage their back pain on their own.  HOME CARE   Stay active. Start with short walks on flat ground if you can. Try to walk farther each day.  Do not sit, drive, or stand in one place for more than 30 minutes. Do not stay in bed.  Do not avoid exercise or work. Activity can help your back heal faster.  Be careful when you bend or lift an object. Bend at your knees, keep the object close to you, and do not twist.  Sleep on a firm mattress. Lie on your side, and bend your knees. If you lie on your back, put a pillow under your knees.  Only take medicines as told by your doctor.  Put ice on the injured area.  Put ice in a plastic bag.  Place a towel between your skin and the bag.  Leave the ice on for 15-20 minutes, 03-04 times a day for the first 2 to 3 days. After that, you  can switch between ice and heat packs.  Ask your doctor about back exercises or massage.  Avoid feeling anxious or stressed. Find good ways to deal with stress, such as exercise. GET HELP RIGHT AWAY IF:   Your pain does not go away with rest or medicine.  Your pain does not go away in 1 week.  You have new problems.  You do not feel well.  The pain spreads into your legs.  You cannot control when you poop (bowel movement) or pee (urinate).  Your arms or legs feel weak or lose feeling (numbness).  You feel sick to your stomach (nauseous) or throw up (vomit).  You have belly (abdominal) pain.  You feel like you may pass out (faint). MAKE SURE YOU:     Understand these instructions.  Will watch your condition.  Will get help right away if you are not doing well or get worse. Document Released: 05/02/2008 Document Revised: 02/06/2012 Document Reviewed: 03/18/2014 [facility] Patient Information 2015 [place], [place]. This information is not intended to replace advice given to you by your health care provider. Make sure you discuss any questions you have with your health care provider.  Back Exercises Back exercises help treat and prevent back injuries. The goal is to increase your strength in your belly (abdominal) and back muscles. These exercises can also help with flexibility. Start these exercises when told by your doctor. HOME CARE Back exercises include: Pelvic Tilt.  Lie on your back with your knees bent. Tilt your pelvis until the lower part of your back is against the floor. Hold this position 5 to 10 sec. Repeat this exercise 5 to 10 times. Knee to Chest.  Pull 1 knee up against your chest and hold for 20 to 30 seconds. Repeat this with the other knee. This may be done with the other leg straight or bent, whichever feels better. Then, pull both knees up against your chest. Sit-Ups or Curl-Ups.  Bend your knees 90 degrees. Start with tilting your pelvis, and do a partial, slow sit-up. Only lift your upper half 30 to 45 degrees off the floor. Take at least 2 to 3 seonds for each sit-up. Do not do sit-ups with your knees out straight. If partial sit-ups are difficult, simply do the above but with only tightening your belly (abdominal) muscles and holding it as told. Hip-Lift.  Lie on your back with your knees flexed 90 degrees. Push down with your feet and shoulders as you raise your hips 2 inches off the floor. Hold for 10 seconds, repeat 5 to 10 times. Back Arches.  Lie on your stomach. Prop yourself up on bent elbows. Slowly press on your hands, causing an arch in your low back. Repeat 3 to 5 times. Shoulder-Lifts.  Lie face  down with arms beside your body. Keep hips and belly pressed to floor as you slowly lift your head and shoulders off the floor. Do not overdo your exercises. Be careful in the beginning. Exercises may cause you some mild back discomfort. If the pain lasts for more than 15 minutes, stop the exercises until you see your doctor. Improvement with exercise for back problems is slow.  Document Released: 12/17/2010 Document Revised: 02/06/2012 Document Reviewed: 09/15/2011 [facility] Patient Information 2015 [place], [place]. This information is not intended to replace advice given to you by your health care provider. Make sure you discuss any questions you have with your health care provider.  Back Injury Prevention Back injuries can be extremely painful and  difficult to heal. After having one back injury, you are much more likely to experience another later on. It is important to learn how to avoid injuring or re-injuring your back. The following tips can help you to prevent a back injury. PHYSICAL FITNESS  Exercise regularly and try to develop good tone in your abdominal muscles. Your abdominal muscles provide a lot of the support needed by your back.  Do aerobic exercises (walking, jogging, biking, swimming) regularly.  Do exercises that increase balance and strength (tai chi, yoga) regularly. This can decrease your risk of falling and injuring your back.  Stretch before and after exercising.  Maintain a healthy weight. The more you weigh, the more stress is placed on your back. For every pound of weight, 10 times that amount of pressure is placed on the back. DIET  Talk to your caregiver about how much calcium and vitamin D you need per day. These nutrients help to prevent weakening of the bones (osteoporosis). Osteoporosis can cause broken (fractured) bones that lead to back pain.  Include good sources of calcium in your diet, such as dairy products, green, leafy vegetables, and products with  calcium added (fortified).  Include good sources of vitamin D in your diet, such as milk and foods that are fortified with vitamin D.  Consider taking a nutritional supplement or a multivitamin if needed.  Stop smoking if you smoke. POSTURE  Sit and stand up straight. Avoid leaning forward when you sit or hunching over when you stand.  Choose chairs with good low back (lumbar) support.  If you work at a desk, sit close to your work so you do not need to lean over. Keep your chin tucked in. Keep your neck drawn back and elbows bent at a right angle. Your arms should look like the letter "L."  Sit high and close to the steering wheel when you drive. Add a lumbar support to your car seat if needed.  Avoid sitting or standing in one position for too long. Take breaks to get up, stretch, and walk around at least once every hour. Take breaks if you are driving for long periods of time.  Sleep on your side with your knees slightly bent, or sleep on your back with a pillow under your knees. Do not sleep on your stomach. LIFTING, TWISTING, AND REACHING  Avoid heavy lifting, especially repetitive lifting. If you must do heavy lifting:  Stretch before lifting.  Work slowly.  Rest between lifts.  Use carts and dollies to move objects when possible.  Make several small trips instead of carrying 1 heavy load.  Ask for help when you need it.  Ask for help when moving big, awkward objects.  Follow these steps when lifting:  Stand with your feet shoulder-width apart.  Get as close to the object as you can. Do not try to pick up heavy objects that are far from your body.  Use handles or lifting straps if they are available.  Bend at your knees. Squat down, but keep your heels off the floor.  Keep your shoulders pulled back, your chin tucked in, and your back straight.  Lift the object slowly, tightening the muscles in your legs, abdomen, and buttocks. Keep the object as close to the  center of your body as possible.  When you put a load down, use these same guidelines in reverse.  Do not:  Lift the object above your waist.  Twist at the waist while lifting or carrying a  load. Move your feet if you need to turn, not your waist.  Bend over without bending at your knees.  Avoid reaching over your head, across a table, or for an object on a high surface. OTHER TIPS  Avoid wet floors and keep sidewalks clear of ice to prevent falls.  Do not sleep on a mattress that is too soft or too hard.  Keep items that are used frequently within easy reach.  Put heavier objects on shelves at waist level and lighter objects on lower or higher shelves.  Find ways to decrease your stress, such as exercise, massage, or relaxation techniques. Stress can build up in your muscles. Tense muscles are more vulnerable to injury.  Seek treatment for depression or anxiety if needed. These conditions can increase your risk of developing back pain. SEEK MEDICAL CARE IF:  You injure your back.  You have questions about diet, exercise, or other ways to prevent back injuries. MAKE SURE YOU:  Understand these instructions.  Will watch your condition.  Will get help right away if you are not doing well or get worse. Document Released: 12/22/2004 Document Revised: 02/06/2012 Document Reviewed: 12/26/2011 [facility] Patient Information 2015 [place], [place]. This information is not intended to replace advice given to you by your health care provider. Make sure you discuss any questions you have with your health care provider.

## 2015-08-27 NOTE — ED Provider Notes (Signed)
CSN: [id number]     Arrival date & time 08/27/15  1602 History   First MD Initiated Contact with Patient 08/27/15 1842     Chief Complaint  Patient presents with  . Back Pain    The patient said she started having back pain yesterday and it was severe.  She says she has urgency and sometimes pees on herself.       (Consider location/radiation/quality/duration/timing/severity/associated sxs/prior Treatment) HPI Comments: [name] is a 44 y.o. female with a PMHx of anemia, anxiety, depression, chronic headaches, and GERD, who presents to the ED with complaints of low back pain 1 week. She describes the pain is 10/10 constant sharp radiating to the coccyx area, worse with bending, movement, and standing, and unrelieved with ibuprofen at home. She reports that previously she had increased urinary urgency and frequency and episodes of incontinence which began when she started having vaginal bleeding 4-5 months ago, this has since subsided and she has had no incontinence episode since her back pain started. She is currently on Provera for her vaginal bleeding, with improvement. She states that 3-4 days ago she noticed some bright red blood on the toilet paper when she wiped, she thinks this came from her rectum, and was painless. She states she has also noticed her stools become black since she started taking iron supplementation recently. She endorses heavy lifting at work, and is asking for a work note for her back pain.  She denies any fevers, chills, chest pain, shortness breath, abdominal pain, nausea, vomiting, diarrhea, constipation, rectal pain, dysuria, flank pain, numbness, tingling, weakness, cauda equina symptoms, incontinence of urine or stool, headache, vision changes, IV drug use, or history of cancer. She is a smoker.  She states she saw her PCP already for this, who wrote her out for a few days from work, but the note has her returning to work today.  Patient is a 44 y.o. female  presenting with back pain. The history is provided by the patient. No language interpreter was used.  Back Pain Location:  Lumbar spine Quality: sharp. Radiates to: to coccyx. Pain severity:  Moderate Pain is:  Same all the time Onset quality:  Gradual Duration:  1 week Timing:  Constant Progression:  Unchanged Chronicity:  New Context: lifting heavy objects   Relieved by:  Nothing Worsened by:  Bending, movement and standing Ineffective treatments:  Ibuprofen Associated symptoms: no abdominal pain, no bladder incontinence, no bowel incontinence, no chest pain, no dysuria, no fever, no headaches, no numbness, no paresthesias, no perianal numbness, no tingling and no weakness   Risk factors: no hx of cancer     Past Medical History  Diagnosis Date  . Anemia   . Abnormal Pap smear of cervix   . Anxiety   . Depression   . GERD (gastroesophageal reflux disease)   . Headache   . Bacterial vaginosis    Past Surgical History  Procedure Laterality Date  . Abscess     History reviewed. No pertinent family history. Social History  Substance Use Topics  . Smoking status: Current Every Day Smoker  . Smokeless tobacco: Never Used  . Alcohol Use: Yes     Comment: drinks daily, 2- 40oz today    OB History    Gravida Para Term Preterm AB TAB SAB Ectopic Multiple Living   0 1 0 0 0  3     Review of Systems  Constitutional: Negative for fever and chills.  Eyes: Negative  for visual disturbance.  Respiratory: Negative for shortness of breath.   Cardiovascular: Negative for chest pain.  Gastrointestinal: Positive for blood in stool (3-4 days ago). Negative for nausea, vomiting, abdominal pain, diarrhea, constipation, rectal pain and bowel incontinence.  Genitourinary: Positive for urgency (previously), frequency (previously) and vaginal bleeding (ongoing x months, on Provera, improving). Negative for bladder incontinence, dysuria and flank pain.       No incontinence since back  pain started, previously had some episodes associated with her vaginal bleeding  Musculoskeletal: Positive for back pain. Negative for myalgias and arthralgias.  Skin: Negative for color change.  Allergic/Immunologic: Negative for immunocompromised state.  Neurological: Negative for tingling, weakness, numbness, headaches and paresthesias.  Psychiatric/Behavioral: Negative for confusion.   10 Systems reviewed and are negative for acute change except as noted in the HPI.    Allergies  Penicillins and Morphine and related  Home Medications   Prior to Admission medications   Medication Sig Start Date End Date Taking? Authorizing Provider  ferrous sulfate 325 (65 FE) MG tablet Take 1 tablet (325 mg total) by mouth daily. 06/11/15   Hannah Muthersbaugh, PA-C  ibuprofen (ADVIL,MOTRIN) 600 MG tablet Take 1 tablet (600 mg total) by mouth every 6 (six) hours as needed. 07/15/15   Tereso Newcomer, MD  medroxyPROGESTERone (PROVERA) 10 MG tablet Take 1 tablet (10 mg total) by mouth daily. Can increase dosage to two tablets daily for increased bleeding 07/15/15   Tereso Newcomer, MD  Multiple Vitamins-Minerals (MULTIVITAMIN WITH MINERALS) tablet Take 1 tablet by mouth daily.    Historical Provider, MD   Triage VS: BP 136/93 mmHg  Pulse 90  Temp(Src) 99.9 F (37.7 C) (Oral)  Resp 18  Ht 5\' 1"  (1.549 m)  Wt 149 lb (67.586 kg)  BMI 28.17 kg/m2  SpO2 98%  LMP 08/26/2015 Exam VS: BP 133/90 mmHg  Pulse 85  Temp(Src) 98.4 F (36.9 C) (Oral)  Resp 18  Ht 5\' 1"  (1.549 m)  Wt 149 lb (67.586 kg)  BMI 28.17 kg/m2  SpO2 100%  LMP 08/26/2015 Physical Exam  Constitutional: She is oriented to person, place, and time. Vital signs are normal. She appears well-developed and well-nourished.  Non-toxic appearance. No distress.  Afebrile, nontoxic, NAD  HENT:  Head: Normocephalic and atraumatic.  Mouth/Throat: Oropharynx is clear and moist and mucous membranes are normal.  Eyes: Conjunctivae and EOM are  normal. Right eye exhibits no discharge. Left eye exhibits no discharge.  Neck: Normal range of motion. Neck supple.  Cardiovascular: Normal rate, regular rhythm, normal heart sounds and intact distal pulses.  Exam reveals no gallop and no friction rub.   No murmur heard. Pulmonary/Chest: Effort normal and breath sounds normal. No respiratory distress. She has no decreased breath sounds. She has no wheezes. She has no rhonchi. She has no rales.  Abdominal: Soft. Normal appearance and bowel sounds are normal. She exhibits no distension. There is no tenderness. There is no rigidity, no rebound, no guarding, no CVA tenderness, no tenderness at McBurney's point and negative Murphy's sign.  Genitourinary: Rectal exam shows external hemorrhoid (old skin tag). Rectal exam shows no internal hemorrhoid, no fissure, no mass, no tenderness and anal tone normal. Guaiac negative stool. Pelvic exam was performed with patient in the knee-chest position.  Chaperone present No gross blood noted on rectal exam, black stool noted on glove, normal tone, no tenderness, no mass or fissure, no internal hemorrhoids. +Old external hemorrhoidal skin tag noted. FOBT neg.  Musculoskeletal: Normal range  of motion.       Lumbar back: She exhibits tenderness, bony tenderness and spasm. She exhibits normal range of motion and no deformity.       Back:  Lumbar spine with FROM intact with mild midline spinous process TTP, no bony stepoffs or deformities, mild diffuse b/l paraspinous muscle TTP with palpable muscle spasms. Strength 5/5 in all extremities, sensation grossly intact in all extremities, negative SLR bilaterally, gait steady and nonantalgic. No overlying skin changes.   Neurological: She is alert and oriented to person, place, and time. She has normal strength. No sensory deficit.  Skin: Skin is warm, dry and intact. No rash noted.  Psychiatric: She has a normal mood and affect.  Nursing note and vitals reviewed.   ED  Course  Procedures (including critical care time) Labs Review Labs Reviewed  URINALYSIS, ROUTINE W REFLEX MICROSCOPIC (NOT AT [facility]) - Abnormal; Notable for the following:    Hgb urine dipstick MODERATE (*)    All other components within normal limits  CBC - Abnormal; Notable for the following:    RDW 17.1 (*)    All other components within normal limits  URINE MICROSCOPIC-ADD ON - Abnormal; Notable for the following:    Squamous Epithelial / LPF FEW (*)    All other components within normal limits  POC URINE PREG, ED  POC OCCULT BLOOD, ED    Imaging Review Dg Lumbar Spine Complete  08/27/2015   CLINICAL DATA:  Lumbar spine pain for 1 week, no known injury.  EXAM: LUMBAR SPINE - COMPLETE 4+ VIEW  COMPARISON:  None.  FINDINGS: The alignment is maintained. Vertebral body heights are normal. There is no listhesis. The posterior elements are intact. Disc spaces are preserved. Trace endplate spurring at L3-L4. No fracture. Sacroiliac joints are symmetric and normal.  IMPRESSION: Trace endplate spurring at L3-L4. Otherwise normal radiographs of the lumbar spine.   Electronically Signed   By: Rubye Oaks M.D.   On: 08/27/2015 19:58   I have personally reviewed and evaluated these images and lab results as part of my medical decision-making.   EKG Interpretation None      MDM   Final diagnoses:  Midline low back pain without sciatica  Hematochezia  External hemorrhoids  Degenerative lumbar disc  Tobacco use    44 y.o. female here with back pain x1-2wks. Heavy lifting at work. No red flag s/s of low back pain. No s/s of central cord compression or cauda equina. Lower extremities are neurovascularly intact and patient is ambulating without difficulty. Has had prior urinary urgency/frequency since starting provera for abnormal vaginal bleeding. Previously had incontinence after starting this, but none since her back pain started. Also reports that since she started iron pills, her  stool is black but sometimes she has some BRBPR with wiping. On exam, mild midline spinal TTP in lumbar region, will obtain imaging. Rectal exam with black stool, hemorrhoidal skin tag noted. No bright red blood. Triage temp 99.9 but pt is a smoker, on my exam temp was 98.2. Labs unremarkable, U/A without evidence of UTI. Upreg neg. Will give pain meds and muscle relaxant, then reassess after imaging.  8:31 PM FOBT neg. Lumbar xray showing endplate spurring at L3-4 otherwise WNL. Pain improved after flexeril/norco. Patient was counseled on back pain precautions and told to do activity as tolerated but do not lift, push, or pull heavy objects more than 10 pounds for the next week. Patient counseled to use ice or heat on back for  no longer than 15 minutes every hour.   Rx given for muscle relaxer and counseled on proper use of muscle relaxant medication. Rx given for narcotic pain medicine and counseled on proper use of narcotic pain medications. Told that they can increase to every 4 hrs if needed while pain is worse. Counseled not to combine this medication with others containing tylenol. Urged patient not to drink alcohol, drive, or perform any other activities that requires focus while taking either of these medications.   Patient urged to follow-up with PCP if pain does not improve with treatment and rest or if pain becomes recurrent. Urged to return with worsening severe pain, loss of bowel or bladder control, trouble walking. The patient verbalizes understanding and agrees with the plan.   BP 133/90 mmHg  Pulse 85  Temp(Src) 98.4 F (36.9 C) (Oral)  Resp 18  Ht  (1.549 m)  Wt 149 lb (67.586 kg)  BMI 28.17 kg/m2  SpO2 100%  LMP 08/26/2015  Meds ordered this encounter  Medications  . cyclobenzaprine (FLEXERIL) tablet 5 mg    Sig:   . HYDROcodone-acetaminophen (NORCO/VICODIN) 5-325 MG per tablet 1 tablet    Sig:   . cyclobenzaprine (FLEXERIL) 10 MG tablet    Sig: Take 1 tablet (10  mg total) by mouth 3 (three) times daily as needed for muscle spasms.    Dispense:  15 tablet    Refill:  0    Order Specific Question:  Supervising Provider    Answer:  MILLER, BRIAN [[id number]]  . naproxen (NAPROSYN) 500 MG tablet    Sig: Take 1 tablet (500 mg total) by mouth 2 (two) times daily as needed for mild pain, moderate pain or headache (TAKE WITH MEALS.).    Dispense:  20 tablet    Refill:  0    Order Specific Question:  Supervising Provider    Answer:  MILLER, BRIAN [[id number]]  . HYDROcodone-acetaminophen (NORCO) 5-325 MG tablet    Sig: Take 1 tablet by mouth every 6 (six) hours as needed for severe pain.    Dispense:  6 tablet    Refill:  0    Order Specific Question:  Supervising Provider    Answer:  Eber Hong [[id number]]     Mercedes Camprubi-Soms, PA-C 08/27/15 2041  Courteney Randall An, MD 08/28/15 [id number]

## 2015-10-09 ENCOUNTER — Encounter: Payer: Self-pay | Admitting: *Deleted

## 2015-11-02 ENCOUNTER — Telehealth: Payer: Self-pay | Admitting: *Deleted

## 2015-11-02 ENCOUNTER — Ambulatory Visit: Payer: Self-pay | Admitting: Obstetrics & Gynecology

## 2015-11-02 DIAGNOSIS — N939 Abnormal uterine and vaginal bleeding, unspecified: Secondary | ICD-10-CM

## 2015-11-02 MED ORDER — MEDROXYPROGESTERONE ACETATE 10 MG PO TABS: 10.0000 mg | ORAL_TABLET | Freq: Every day | ORAL | Status: DC

## 2015-11-02 NOTE — Telephone Encounter (Signed)
Patient called requesting a refill on the medication to stop her bleeding. Rx ordered as documented in chart at last visit.

## 2015-11-30 ENCOUNTER — Encounter: Payer: Self-pay | Admitting: Student

## 2015-11-30 ENCOUNTER — Ambulatory Visit (INDEPENDENT_AMBULATORY_CARE_PROVIDER_SITE_OTHER): Payer: Self-pay | Admitting: Student

## 2015-11-30 VITALS — BP 135/90 | HR 86 | Ht 61.0 in | Wt 148.1 lb

## 2015-11-30 DIAGNOSIS — N800 Endometriosis of uterus: Secondary | ICD-10-CM

## 2015-11-30 DIAGNOSIS — N8003 Adenomyosis of the uterus: Secondary | ICD-10-CM

## 2015-11-30 DIAGNOSIS — N939 Abnormal uterine and vaginal bleeding, unspecified: Secondary | ICD-10-CM

## 2015-11-30 DIAGNOSIS — N809 Endometriosis, unspecified: Secondary | ICD-10-CM

## 2015-11-30 NOTE — Progress Notes (Signed)
   Subjective:    Patient ID: [name], female    DOB: 02/17/1971, 45 y.o.   MRN: [id number]  HPI [name] is a 45 y.o. female who presents for follow up for AUB & adenomyosis.  Last seen by Dr. Macon LargeAnyanwu in August & place on provera to manage AUB. Per review of note, Dr. Macon LargeAnyanwu discussed treatment options with patient, medications & surgery included.  On today's visit, patient reports resolution of symptoms. States she has not bled since the end of October & stopped taking the provera then as well.   Review of Systems  Constitutional: Negative.   Gastrointestinal: Negative for abdominal pain.  Genitourinary: Negative for vaginal bleeding, vaginal discharge, vaginal pain, menstrual problem, pelvic pain and dyspareunia.      Objective:   Physical Exam  Constitutional: She is oriented to person, place, and time. She appears well-developed and well-nourished. No distress.  Pulmonary/Chest: Effort normal. No respiratory distress.  Musculoskeletal: Normal range of motion.  Neurological: She is alert and oriented to person, place, and time.  Skin: She is not diaphoretic.  Psychiatric: She has a normal mood and affect. Her behavior is normal. Judgment and thought content normal.   BP 135/90 mmHg  Pulse 86  Ht 5\' 1"  (1.549 m)  Wt 148 lb 1.6 oz (67.178 kg)  BMI 28.00 kg/m2  LMP 08/30/2015      Assessment & Plan:  Patient gets routine care with [facility], who referred her here for AUB. Informed patient at this time she can monitor for symptoms but provera is no longer needed. Patient to return if heavy menstrual bleeding were to occur again.  [facility] paperwork filled out for mammogram. Patient unsure of the last time she had pap smear & we have no records. Patient is self pay, will give information for free pap clinic.   1. Abnormal uterine bleeding (AUB) -bleeding resolved at this time  2. Adenomyosis   Judeth HornErin Kayzen Kendzierski, NP

## 2015-11-30 NOTE — Patient Instructions (Signed)
Abnormal Uterine Bleeding Abnormal uterine bleeding means bleeding from the vagina that is not your normal menstrual period. This can be:  Bleeding or spotting between periods.  Bleeding after sex (sexual intercourse).  Bleeding that is heavier or more than normal.  Periods that last longer than usual.  Bleeding after menopause. There are many problems that may cause this. Treatment will depend on the cause of the bleeding. Any kind of bleeding that is not normal should be reviewed by your doctor.  HOME CARE Watch your condition for any changes. These actions may lessen any discomfort you are having:  Do not use tampons or douches as told by your doctor.  Change your pads often. You should get regular pelvic exams and Pap tests. Keep all appointments for tests as told by your doctor. GET HELP IF:  You are bleeding for more than 1 week.  You feel dizzy at times. GET HELP RIGHT AWAY IF:   You pass out.  You have to change pads every 15 to 30 minutes.  You have belly pain.  You have a fever.  You become sweaty or weak.  You are passing large blood clots from the vagina.  You feel sick to your stomach (nauseous) and throw up (vomit). MAKE SURE YOU:  Understand these instructions.  Will watch your condition.  Will get help right away if you are not doing well or get worse.   This information is not intended to replace advice given to you by your health care provider. Make sure you discuss any questions you have with your health care provider.   Document Released: 09/11/2009 Document Revised: 11/19/2013 Document Reviewed: 06/13/2013 Elsevier Interactive Patient Education 2016 [employer]. Mammogram A mammogram is an X-ray of the breasts that is done to check for abnormal changes. This procedure can screen for and detect any changes that may suggest breast cancer. A mammogram can also identify other changes and variations in the breast, such as:  Inflammation of the  breast tissue (mastitis).  An infected area that contains a collection of pus (abscess).  A fluid-filled sac (cyst).  Fibrocystic changes. This is when breast tissue becomes denser, which can make the tissue feel rope-like or uneven under the skin.  Tumors that are not cancerous (benign). LET [facility] CARE PROVIDER KNOW ABOUT:  Any allergies you have.  If you have breast implants.  If you have had previous breast disease, biopsy, or surgery.  If you are breastfeeding.  Any possibility that you could be pregnant, if this applies.  If you are younger than age 54.  If you have a family history of breast cancer. RISKS AND COMPLICATIONS Generally, this is a safe procedure. However, problems may occur, including:  Exposure to radiation. Radiation levels are very low with this test.  The results being misinterpreted.  The need for further tests.  The inability of the mammogram to detect certain cancers. BEFORE THE PROCEDURE  Schedule your test about 1-2 weeks after your menstrual period. This is usually when your breasts are the least tender.  If you have had a mammogram done at a different facility in the past, get the mammogram X-rays or have them sent to your current exam facility in order to compare them.  Wash your breasts and under your arms the day of the test.  Do not wear deodorants, perfumes, lotions, or powders anywhere on your body on the day of the test.  Remove any jewelry from your neck.  Wear clothes that you  can change into and out of easily. PROCEDURE  You will undress from the waist up and put on a gown.  You will stand in front of the X-ray machine.  Each breast will be placed between two plastic or glass plates. The plates will compress your breast for a few seconds. Try to stay as relaxed as possible during the procedure. This does not cause any harm to your breasts and any discomfort you feel will be very brief.  X-rays will be taken from  different angles of each breast. The procedure may vary among health care providers and hospitals. AFTER THE PROCEDURE  The mammogram will be examined by a specialist (radiologist).  You may need to repeat certain parts of the test, depending on the quality of the images. This is commonly done if the radiologist needs a better view of the breast tissue.  Ask when your test results will be ready. Make sure you get your test results.  You may resume your normal activities.   This information is not intended to replace advice given to you by your health care provider. Make sure you discuss any questions you have with your health care provider.   Document Released: 11/11/2000 Document Revised: 08/05/2015 Document Reviewed: 01/23/2015 Elsevier Interactive Patient Education [employer]2016 Elsevier Inc.

## 2016-10-24 ENCOUNTER — Emergency Department (HOSPITAL_COMMUNITY): Payer: Self-pay

## 2016-10-24 ENCOUNTER — Emergency Department (HOSPITAL_COMMUNITY)
Admission: EM | Admit: 2016-10-24 | Discharge: 2016-10-24 | Disposition: A | Discharge status: Home / Self Care | Payer: Self-pay | Attending: Emergency Medicine | Admitting: Emergency Medicine

## 2016-10-24 ENCOUNTER — Encounter (HOSPITAL_COMMUNITY): Payer: Self-pay | Admitting: *Deleted

## 2016-10-24 DIAGNOSIS — M545 Low back pain, unspecified: Secondary | ICD-10-CM

## 2016-10-24 DIAGNOSIS — G8929 Other chronic pain: Secondary | ICD-10-CM

## 2016-10-24 DIAGNOSIS — J40 Bronchitis, not specified as acute or chronic: Secondary | ICD-10-CM

## 2016-10-24 DIAGNOSIS — R21 Rash and other nonspecific skin eruption: Secondary | ICD-10-CM

## 2016-10-24 DIAGNOSIS — Y999 Unspecified external cause status: Secondary | ICD-10-CM | POA: Insufficient documentation

## 2016-10-24 DIAGNOSIS — W1839XA Other fall on same level, initial encounter: Secondary | ICD-10-CM | POA: Insufficient documentation

## 2016-10-24 DIAGNOSIS — Y9389 Activity, other specified: Secondary | ICD-10-CM | POA: Insufficient documentation

## 2016-10-24 DIAGNOSIS — Y9289 Other specified places as the place of occurrence of the external cause: Secondary | ICD-10-CM | POA: Insufficient documentation

## 2016-10-24 DIAGNOSIS — R079 Chest pain, unspecified: Secondary | ICD-10-CM

## 2016-10-24 DIAGNOSIS — S52692A Other fracture of lower end of left ulna, initial encounter for closed fracture: Secondary | ICD-10-CM | POA: Insufficient documentation

## 2016-10-24 LAB — POC URINE PREG, ED: PREG TEST UR: NEGATIVE

## 2016-10-24 MED ORDER — PREDNISONE 20 MG PO TABS
60.0000 mg | ORAL_TABLET | Freq: Once | ORAL | Status: AC
  Administered 2016-10-24: 60 mg via ORAL
  Filled 2016-10-24: qty 3

## 2016-10-24 MED ORDER — PREDNISONE 20 MG PO TABS: 20.0000 mg | ORAL_TABLET | Freq: Two times a day (BID) | ORAL | 0 refills | Status: DC

## 2016-10-24 MED ORDER — METHOCARBAMOL 500 MG PO TABS
500.0000 mg | ORAL_TABLET | Freq: Two times a day (BID) | ORAL | 0 refills | Status: DC
Start: 2016-10-24 — End: 2020-05-30

## 2016-10-24 MED ORDER — METHOCARBAMOL 500 MG PO TABS
500.0000 mg | ORAL_TABLET | Freq: Once | ORAL | Status: AC
  Administered 2016-10-24: 500 mg via ORAL
  Filled 2016-10-24: qty 1

## 2016-10-24 NOTE — Discharge Instructions (Signed)
Stop smoking.  See your Primary Care physician for ongoing health needs.

## 2016-10-24 NOTE — ED Provider Notes (Signed)
[facility]-EMERGENCY DEPT Provider Note   CSN: [id number] Arrival date & time: 10/24/16  1844     History   Chief Complaint Chief Complaint  Patient presents with  . Back Pain  . Rash  . Cough    HPI [name] is a 45 y.o. female.She presents with multiple complaints  HPI she presents for evaluation of a cough. States she "has a cough all the time". She smokes. States it's "dark brown" he "even when not drinking". Has no chest pain or shortness of breath no fever no wheezing. Also states she has a rash on her arms. She has had this intermittently for the last few weeks. At times it is elevated and itches. Does not have a progressing or anywhere else on her body. Other complaint is back pain. She has chronic back pain. States she had to quit her job working at the [employer]. No pain rating and her legs. No bowel or bladder symptoms. No red flag symptoms. She has angina to her here.  Past Medical History:  Diagnosis Date  . Abnormal Pap smear of cervix   . Anemia   . Anxiety   . Bacterial vaginosis   . Depression   . GERD (gastroesophageal reflux disease)   . Headache     Patient Active Problem List   Diagnosis Date Noted  . Adenomyosis 07/15/2015  . ETOH abuse 07/14/2015  . Abnormal uterine bleeding (AUB) 07/14/2015    Past Surgical History:  Procedure Laterality Date  . abscess      OB History    Gravida Para Term Preterm AB Living   4 3 3  0 1 3   SAB TAB Ectopic Multiple Live Births   0 0 0           Home Medications    Prior to Admission medications   Medication Sig Start Date End Date Taking? Authorizing Provider  cyclobenzaprine (FLEXERIL) 10 MG tablet Take 1 tablet (10 mg total) by mouth 3 (three) times daily as needed for muscle spasms. Patient not taking: Reported on 11/30/2015 08/27/15   Mercedes Camprubi-Soms, PA-C  ferrous sulfate 325 (65 FE) MG tablet Take 325 mg by mouth daily with breakfast. Reported on 11/30/2015    Historical Provider, MD    HYDROcodone-acetaminophen (NORCO) 5-325 MG tablet Take 1 tablet by mouth every 6 (six) hours as needed for severe pain. Patient not taking: Reported on 11/30/2015 08/27/15   Mercedes Camprubi-Soms, PA-C  ibuprofen (ADVIL,MOTRIN) 600 MG tablet Take 1 tablet (600 mg total) by mouth every 6 (six) hours as needed. Patient not taking: Reported on 11/30/2015 07/15/15   Tereso NewcomerUgonna A Anyanwu, MD  medroxyPROGESTERone (PROVERA) 10 MG tablet Take 1 tablet (10 mg total) by mouth daily. Can increase dosage to two tablets daily for increased bleeding Patient not taking: Reported on 11/30/2015 11/02/15   Tereso NewcomerUgonna A Anyanwu, MD  methocarbamol (ROBAXIN) 500 MG tablet Take 1 tablet (500 mg total) by mouth 2 (two) times daily. 10/24/16   Rolland PorterMark Darinda [place], MD  naproxen (NAPROSYN) 500 MG tablet Take 1 tablet (500 mg total) by mouth 2 (two) times daily as needed for mild pain, moderate pain or headache (TAKE WITH MEALS.). Patient not taking: Reported on 11/30/2015 08/27/15   Mercedes Camprubi-Soms, PA-C  predniSONE (DELTASONE) 20 MG tablet Take 1 tablet (20 mg total) by mouth 2 (two) times daily with a meal. 10/24/16   Rolland PorterMark Wylma Tatem, MD    Family History No family history on file.  Social History Social History  Substance Use Topics  . Smoking status: Current Every Day Smoker    Packs/day: 2.00    Types: Cigarettes  . Smokeless tobacco: Never Used  . Alcohol use Yes     Comment: drinks daily, 2- 40oz today      Allergies   Penicillins and Morphine and related   Review of Systems Review of Systems  Constitutional: Negative for appetite change, chills, diaphoresis, fatigue and fever.  HENT: Negative for mouth sores, sore throat and trouble swallowing.   Eyes: Negative for visual disturbance.  Respiratory: Positive for cough. Negative for chest tightness, shortness of breath and wheezing.   Cardiovascular: Negative for chest pain.  Gastrointestinal: Negative for abdominal distention, abdominal pain, diarrhea, nausea and  vomiting.  Endocrine: Negative for polydipsia, polyphagia and polyuria.  Genitourinary: Negative for dysuria, frequency and hematuria.  Musculoskeletal: Positive for back pain. Negative for gait problem.  Skin: Positive for rash. Negative for color change and pallor.  Neurological: Negative for dizziness, syncope, light-headedness and headaches.  Hematological: Does not bruise/bleed easily.  Psychiatric/Behavioral: Negative for behavioral problems and confusion.     Physical Exam Updated Vital Signs BP 134/98 (BP Location: Right Arm)   Pulse 106   Temp 98.5 F (36.9 C) (Oral)   Resp 16   Ht 5\' 1"  (1.549 m)   Wt 148 lb (67.1 kg)   LMP 10/14/2016   SpO2 100%   BMI 27.96 kg/m   Physical Exam  Constitutional: She is oriented to person, place, and time. She appears well-developed and well-nourished. No distress.  HENT:  Head: Normocephalic.  Eyes: Conjunctivae are normal. Pupils are equal, round, and reactive to light. No scleral icterus.  Neck: Normal range of motion. Neck supple. No thyromegaly present.  Cardiovascular: Normal rate and regular rhythm.  Exam reveals no gallop and no friction rub.   No murmur heard. Pulmonary/Chest: Effort normal and breath sounds normal. No respiratory distress. She has no wheezes. She has no rales.  Clear bilateral breath sounds. No wheezing, rales, rhonchi or prolongation. 97% saturations. Temp 98.7.  Abdominal: Soft. Bowel sounds are normal. She exhibits no distension. There is no tenderness. There is no rebound.  Musculoskeletal: Normal range of motion.       Back:  Neurological: She is alert and oriented to person, place, and time.  Normal symmetric Strength to shoulder shrug, triceps, biceps, grip,wrist flex/extend,and intrinsics  Norma lsymmetric sensation above and below clavicles, and to all distributions to UEs. Norma symmetric strength to flex/.extend hip and knees, dorsi/plantar flex ankles. Normal symmetric sensation to all  distributions to LEs Patellar and achilles reflexes 1-2+. Downgoing Babinski   Skin: Skin is warm and dry. No rash noted.  Elevated macular rash on bilateral forearms.? Nodosum. Not urticarial.  Psychiatric: She has a normal mood and affect. Her behavior is normal.     ED Treatments / Results  Labs (all labs ordered are listed, but only abnormal results are displayed) Labs Reviewed  POC URINE PREG, ED    EKG  EKG Interpretation None       Radiology Dg Chest 2 View  Result Date: 10/24/2016 CLINICAL DATA:  Low back pain.  Skin rash.  Chest pain. EXAM: CHEST  2 VIEW COMPARISON:  04/19/2010. FINDINGS: Poor inspiration on the frontal view. Heart and mediastinal shadows are normal. The lungs are clear. No pleural fluid. No pneumothorax. No bone abnormality. IMPRESSION: Normal chest.  Poor inspiration on the frontal view. Electronically Signed   By: Paulina Fusi M.D.   On:  10/24/2016 19:38    Procedures Procedures (including critical care time)  Medications Ordered in ED Medications  predniSONE (DELTASONE) tablet 60 mg (not administered)  methocarbamol (ROBAXIN) tablet 500 mg (not administered)     Initial Impression / Assessment and Plan / ED Course  I have reviewed the triage vital signs and the nursing notes.  Pertinent labs & imaging results that were available during my care of the patient were reviewed by me and considered in my medical decision making (see chart for details).  Clinical Course     Nonspecific back pain that is reproducible. Likely muscular. No symptoms or findings to suggest radiculopathy. We appears to be a erythema nodosum rash. Chronic cough in a smoker with a negative chest x-ray and exam.  Final Clinical Impressions(s) / ED Diagnoses   Final diagnoses:  Bronchitis  Chronic midline low back pain without sciatica  Rash    New Prescriptions New Prescriptions   METHOCARBAMOL (ROBAXIN) 500 MG TABLET    Take 1 tablet (500 mg total) by mouth 2  (two) times daily.   PREDNISONE (DELTASONE) 20 MG TABLET    Take 1 tablet (20 mg total) by mouth 2 (two) times daily with a meal.     Rolland PorterMark Shamya Macfadden, MD 10/24/16 2048

## 2016-10-24 NOTE — ED Triage Notes (Signed)
Pt here via [facility] for lower back pain, rash on forearms and chest pain when she coughs.  Back pain increases with movement.  Pt is intoxicated.

## 2019-08-22 ENCOUNTER — Emergency Department (HOSPITAL_COMMUNITY): Payer: Self-pay

## 2019-08-22 ENCOUNTER — Encounter: Payer: Self-pay | Admitting: Emergency Medicine

## 2019-08-22 ENCOUNTER — Emergency Department (HOSPITAL_COMMUNITY)
Admission: EM | Admit: 2019-08-22 | Discharge: 2019-08-22 | Disposition: A | Discharge status: Home / Self Care | Payer: Self-pay | Attending: Emergency Medicine | Admitting: Emergency Medicine

## 2019-08-22 DIAGNOSIS — R55 Syncope and collapse: Secondary | ICD-10-CM

## 2019-08-22 DIAGNOSIS — F17210 Nicotine dependence, cigarettes, uncomplicated: Secondary | ICD-10-CM | POA: Insufficient documentation

## 2019-08-22 DIAGNOSIS — R232 Flushing: Secondary | ICD-10-CM | POA: Insufficient documentation

## 2019-08-22 DIAGNOSIS — D649 Anemia, unspecified: Secondary | ICD-10-CM

## 2019-08-22 DIAGNOSIS — R0789 Other chest pain: Secondary | ICD-10-CM | POA: Insufficient documentation

## 2019-08-22 DIAGNOSIS — Z79899 Other long term (current) drug therapy: Secondary | ICD-10-CM | POA: Insufficient documentation

## 2019-08-22 LAB — URINALYSIS, ROUTINE W REFLEX MICROSCOPIC
Bilirubin Urine: NEGATIVE
Glucose, UA: NEGATIVE mg/dL
Hgb urine dipstick: NEGATIVE
Ketones, ur: NEGATIVE mg/dL
Leukocytes,Ua: NEGATIVE
Nitrite: NEGATIVE
Protein, ur: NEGATIVE mg/dL
Specific Gravity, Urine: >1.03 — ABNORMAL HIGH (ref 1.005–1.030)
pH: 5 (ref 5.0–8.0)

## 2019-08-22 LAB — CBC WITH DIFFERENTIAL/PLATELET
Abs Immature Granulocytes: 0 10*3/uL (ref 0.00–0.07)
Basophils Absolute: 0 10*3/uL (ref 0.0–0.1)
Basophils Relative: 0 %
Eosinophils Absolute: 0.5 10*3/uL (ref 0.0–0.5)
Eosinophils Relative: 5 %
HCT: 32.4 % — ABNORMAL LOW (ref 36.0–46.0)
Hemoglobin: 8.3 g/dL — ABNORMAL LOW (ref 12.0–15.0)
Lymphocytes Relative: 7 %
Lymphs Abs: 0.7 10*3/uL (ref 0.7–4.0)
MCH: 17.1 pg — ABNORMAL LOW (ref 26.0–34.0)
MCHC: 25.6 g/dL — ABNORMAL LOW (ref 30.0–36.0)
MCV: 66.9 fL — ABNORMAL LOW (ref 80.0–100.0)
Monocytes Absolute: 0 10*3/uL — ABNORMAL LOW (ref 0.1–1.0)
Monocytes Relative: 0 %
Neutro Abs: 8.3 10*3/uL — ABNORMAL HIGH (ref 1.7–7.7)
Neutrophils Relative %: 88 %
Platelets: 382 10*3/uL (ref 150–400)
RBC: 4.84 MIL/uL (ref 3.87–5.11)
RDW: 22 % — ABNORMAL HIGH (ref 11.5–15.5)
WBC: 9.4 10*3/uL (ref 4.0–10.5)
nRBC: 0 % (ref 0.0–0.2)
nRBC: 0 /100 WBC

## 2019-08-22 LAB — BASIC METABOLIC PANEL
Anion gap: 11 (ref 5–15)
BUN: 7 mg/dL (ref 6–20)
CO2: 21 mmol/L — ABNORMAL LOW (ref 22–32)
Calcium: 8.8 mg/dL — ABNORMAL LOW (ref 8.9–10.3)
Chloride: 110 mmol/L (ref 98–111)
Creatinine, Ser: 0.73 mg/dL (ref 0.44–1.00)
GFR calc Af Amer: >60 mL/min (ref >60)
GFR calc non Af Amer: >60 mL/min (ref >60)
Glucose, Bld: 99 mg/dL (ref 70–99)
Potassium: 4.3 mmol/L (ref 3.5–5.1)
Sodium: 142 mmol/L (ref 135–145)

## 2019-08-22 LAB — CBG MONITORING, ED: Glucose-Capillary: 110 mg/dL — ABNORMAL HIGH (ref 70–99)

## 2019-08-22 LAB — ETHANOL: Alcohol, Ethyl (B): <10 mg/dL (ref <10)

## 2019-08-22 LAB — POC URINE PREG, ED: Preg Test, Ur: NEGATIVE

## 2019-08-22 MED ORDER — FERROUS SULFATE 325 (65 FE) MG PO TABS: 325.0000 mg | ORAL_TABLET | Freq: Every day | ORAL | 0 refills | Status: DC

## 2019-08-22 MED ORDER — SODIUM CHLORIDE 0.9 % IV SOLN: INTRAVENOUS | Status: DC

## 2019-08-22 MED ORDER — SODIUM CHLORIDE 0.9 % IV BOLUS
1000.0000 mL | Freq: Once | INTRAVENOUS | Status: AC
  Administered 2019-08-22: 1000 mL via INTRAVENOUS

## 2019-08-22 NOTE — ED Notes (Signed)
When pt ambulated to bathroom she had steady gait no assistance was needed. Pt stated that she was alittle dizzy. No SOB or feeling like she ill faint

## 2019-08-22 NOTE — ED Provider Notes (Signed)
[facility]   CSN: [id number] Arrival date & time: 08/22/19  [id number]     History   Chief Complaint No chief complaint on file.   HPI [name] is a 48 y.o. female.     The history is provided by the patient and medical records. No language interpreter was used.     48 year old female with history of anemia, alcohol abuse, anxiety, depression, headache, brought here from her workplace via EMS for evaluation of syncope.  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  She was only at her job for approximately 1 hour walking when she experienced hot flash, and the next thing she knows she was on the ground surrounded by people.  She had an apparent syncopal episode.  She did hit the left side of her chest against a palate but report mild chest discomfort.  Currently she denies any significant headache, pain to chest, trouble breathing, neck pain, abdominal pain, pain to extremities, any focal numbness or weakness.  She felt normal going to work this a.m.  She ate a breakfast.  Yesterday she felt fine.  She does admits to drinking small amount of alcohol the night before and admits to tobacco use.  She does not take a medication regular basis.  She denies history of diabetes.  Patient reports she has been experiencing hot flashes for the past 3 to 4 months.  Past Medical History:  Diagnosis Date  . Abnormal Pap smear of cervix   . Anemia   . Anxiety   . Bacterial vaginosis   . Depression   . GERD (gastroesophageal reflux disease)   . Headache     Patient Active Problem List   Diagnosis Date Noted  . Adenomyosis 07/15/2015  . ETOH abuse 07/14/2015  . Abnormal uterine bleeding (AUB) 07/14/2015    Past Surgical History:  Procedure Laterality Date  . abscess       OB History    Gravida  4   Para  3   Term  3   Preterm  0   AB  1   Living  3     SAB  0   TAB  0   Ectopic  0   Multiple      Live Births               Home Medications    Prior to Admission medications   Medication Sig Start Date End Date Taking? Authorizing Provider  cyclobenzaprine (FLEXERIL) 10 MG tablet Take 1 tablet (10 mg total) by mouth 3 (three) times daily as needed for muscle spasms. Patient not taking: Reported on 11/30/2015 08/27/15   Street, [place], PA-C  ferrous sulfate 325 (65 FE) MG tablet Take 325 mg by mouth daily with breakfast. Reported on 11/30/2015    [provider]  HYDROcodone-acetaminophen (NORCO) 5-325 MG tablet Take 1 tablet by mouth every 6 (six) hours as needed for severe pain. Patient not taking: Reported on 11/30/2015 08/27/15   Street, [place], PA-C  ibuprofen (ADVIL,MOTRIN) 600 MG tablet Take 1 tablet (600 mg total) by mouth every 6 (six) hours as needed. Patient not taking: Reported on 11/30/2015 07/15/15   Tereso NewcomerAnyanwu, Ugonna A, MD  medroxyPROGESTERone (PROVERA) 10 MG tablet Take 1 tablet (10 mg total) by mouth daily. Can increase dosage to two tablets daily for increased bleeding Patient not taking: Reported on 11/30/2015 11/02/15   Anyanwu, Jethro BastosUgonna A, MD  methocarbamol (ROBAXIN)  500 MG tablet Take 1 tablet (500 mg total) by mouth 2 (two) times daily. 10/24/16   Rolland Porter, MD  naproxen (NAPROSYN) 500 MG tablet Take 1 tablet (500 mg total) by mouth 2 (two) times daily as needed for mild pain, moderate pain or headache (TAKE WITH MEALS.). Patient not taking: Reported on 11/30/2015 08/27/15   Street, [place], PA-C  predniSONE (DELTASONE) 20 MG tablet Take 1 tablet (20 mg total) by mouth 2 (two) times daily with a meal. 10/24/16   Rolland Porter, MD    Family History History reviewed. No pertinent family history.  Social History Social History   Tobacco Use  . Smoking status: Current Every Day Smoker    Packs/day: 2.00    Types: Cigarettes  . Smokeless tobacco: Never Used  Substance Use Topics  . Alcohol use: Yes    Comment: drinks daily, 2- 40oz today   .  Drug use: No     Allergies   Penicillins and Morphine and related   Review of Systems Review of Systems  All other systems reviewed and are negative.    Physical Exam Updated Vital Signs BP 133/74 (BP Location: Right Arm)   Pulse 95   Temp 98.7 F (37.1 C) (Oral)   Resp 16   SpO2 100%   Physical Exam Vitals signs and nursing note reviewed.  Constitutional:      General: She is not in acute distress.    Appearance: She is well-developed.  HENT:     Head: Normocephalic and atraumatic.  Eyes:     Extraocular Movements: Extraocular movements intact.     Conjunctiva/sclera: Conjunctivae normal.     Pupils: Pupils are equal, round, and reactive to light.  Neck:     Musculoskeletal: Normal range of motion and neck supple.  Cardiovascular:     Rate and Rhythm: Normal rate and regular rhythm.     Pulses: Normal pulses.     Heart sounds: Normal heart sounds.  Pulmonary:     Effort: Pulmonary effort is normal.     Breath sounds: Normal breath sounds.  Chest:     Chest wall: Tenderness (Mild tenderness to left lateral chest wall without any signs of injury.) present.  Abdominal:     Palpations: Abdomen is soft.     Tenderness: There is no abdominal tenderness.  Musculoskeletal: Normal range of motion.     Comments: 5 out of 5 strength all 4 extremities  Skin:    Findings: No rash.  Neurological:     Mental Status: She is alert and oriented to person, place, and time.     GCS: GCS eye subscore is 4. GCS verbal subscore is 5. GCS motor subscore is 6.     Cranial Nerves: Cranial nerves are intact.     Sensory: Sensation is intact.     Motor: Motor function is intact.     Coordination: Coordination is intact.     Gait: Gait is intact.      ED Treatments / Results  Labs (all labs ordered are listed, but only abnormal results are displayed) Labs Reviewed  BASIC METABOLIC PANEL - Abnormal; Notable for the following components:      Result Value   CO2 21 (*)     Calcium 8.8 (*)    All other components within normal limits  CBC WITH DIFFERENTIAL/PLATELET - Abnormal; Notable for the following components:   Hemoglobin 8.3 (*)    HCT 32.4 (*)    MCV 66.9 (*)  MCH 17.1 (*)    MCHC 25.6 (*)    RDW 22.0 (*)    Neutro Abs 8.3 (*)    Monocytes Absolute 0.0 (*)    All other components within normal limits  URINALYSIS, ROUTINE W REFLEX MICROSCOPIC - Abnormal; Notable for the following components:   APPearance CLOUDY (*)    Specific Gravity, Urine >1.030 (*)    All other components within normal limits  CBG MONITORING, ED - Abnormal; Notable for the following components:   Glucose-Capillary 110 (*)    All other components within normal limits  ETHANOL  POC URINE PREG, ED  POC OCCULT BLOOD, ED    EKG EKG Interpretation  Date/Time:  Thursday August 22 2019 08:50:16 EDT Ventricular Rate:  95 PR Interval:    QRS Duration: 90 QT Interval:  345 QTC Calculation: 434 R Axis:   58 Text Interpretation:  Sinus rhythm Non-specific ST-t changes v3 and v4 new compared to first prior ekg of 25 June 2010 Confirmed by Margarita Grizzle [phone]) on 08/22/2019 10:25:06 AM    Date: 08/22/2019  Rate: 95  Rhythm: normal sinus rhythm  QRS Axis: normal  Intervals: normal  ST/T Wave abnormalities: normal  Conduction Disutrbances: none  Narrative Interpretation:   Old EKG Reviewed: No significant changes noted     Radiology Ct Head Wo Contrast  Result Date: 08/22/2019 CLINICAL DATA:  Syncope EXAM: CT HEAD WITHOUT CONTRAST TECHNIQUE: Contiguous axial images were obtained from the base of the skull through the vertex without intravenous contrast. COMPARISON:  December 23, 2009 FINDINGS: Brain: Ventricles are normal in size and configuration. There is no intracranial mass, hemorrhage, extra-axial fluid collection, or midline shift. There is minimal small vessel disease in the anterior centra semiovale bilaterally. Elsewhere the brain parenchyma appears  unremarkable. No acute infarct evident. Vascular: There is no hyperdense vessel. There are foci of calcification in each carotid siphon region. Skull: Bony calvarium appears intact. Sinuses/Orbits: There is opacification in a posterior right ethmoid air cell. There is mild mucosal thickening in the lateral right sphenoid sinus. There is a defect in the medial left orbital wall. Visualized orbits otherwise appear symmetric bilaterally. Other: Visualized mastoid air cells are clear. IMPRESSION: 1. Minimal periventricular small vessel disease. No acute infarct. No mass or hemorrhage. 2.  There are foci of arterial vascular calcification. 3.  Foci of paranasal sinus disease. 4.  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 Electronically Signed   By: Bretta Bang III M.D.   On: 08/22/2019 10:33    Procedures Procedures (including critical care time)  Medications Ordered in ED Medications  sodium chloride 0.9 % bolus 1,000 mL (1,000 mLs Intravenous New Bag/Given 08/22/19 0947)    And  0.9 %  sodium chloride infusion (has no administration in time range)     Initial Impression / Assessment and Plan / ED Course  I have reviewed the triage vital signs and the nursing notes.  Pertinent labs & imaging results that were available during my care of the patient were reviewed by me and considered in my medical decision making (see chart for details).        BP (!) 129/105   Pulse 90   Temp 98.7 F (37.1 C) (Oral)   Resp 20   SpO2 100%    Final Clinical Impressions(s) / ED Diagnoses   Final diagnoses:  Syncope and collapse  Symptomatic anemia    ED Discharge Orders         Ordered  ferrous sulfate 325 (65 FE) MG tablet  Daily with breakfast     08/22/19 1058         9:26 AM Patient is at the start of her menopause, having hot flash who is here for an apparent witnessed syncopal episode while at work.  No seizure activities.  No significant signs of injury on exam.   He is alert and oriented x4 and resting comfortably.  Does have history of alcohol abuse and did drink a reportedly small amount last night.  Work-up initiated.  10:50 AM Lab is remarkable for evidence of microcytic anemia with hemoglobin of 8.3.  The remainder of her labs are reassuring, head CT scan without acute finding.  Normal orthostatic vital sign. Will prescribe iron supplementation.  Encourage pt to f/u with PCP for further management of her anemia.  She admits she has similar anemia in the past .  Denies any abnormal bleeding recently.  Return precaution given.     Domenic Moras, PA-C 08/22/19 1059    Pattricia Boss, MD 08/22/19 [phone]

## 2019-08-22 NOTE — ED Triage Notes (Signed)
Pt here from work first day on the job where she had a "hot" flash and passed out this morning , positive loc and hit her side , pt arrived to the ED alert and oriented

## 2019-08-22 NOTE — Discharge Instructions (Signed)
Your hemoglobin is 8.3, which is low today.  Take iron supplementation, follow up with your doctor for a recheck.  Return if you have any concerns.

## 2020-05-01 ENCOUNTER — Ambulatory Visit: Payer: Self-pay | Admitting: *Deleted

## 2020-05-01 ENCOUNTER — Telehealth: Payer: Self-pay | Admitting: *Deleted

## 2020-05-01 NOTE — Telephone Encounter (Signed)
Patient is calling to report she thinks she is having reaction to antibiotic that she was given for tooth infection- she took it for 14 days. Patient reports liquid stool, vomiting and R hand/arm tingling and numbness. Patient just finished medication- end of May. Advise patient normally a reaction to a medication will stop after medication has been stopped- due to 3 days of liquid stool and vomiting started- advised ED for evaluation. Reason for Disposition  [1] Drinking very little AND [2] dehydration suspected (e.g., no urine > 12 hours, very dry mouth, very lightheaded)  Answer Assessment - Initial Assessment Questions 1.   NAME of MEDICATION: "What medicine are you calling about?"     Cylindamycin 2.   QUESTION: "What is your question?"     Possible reaction- diarrhea  3.   PRESCRIBING HCP: "Who prescribed it?" Reason: if prescribed by specialist, call should be referred to that group.     Dentist- Dr Allena Katz 4. SYMPTOMS: "Do you have any symptoms?"     Diarrhea and arm numbness- hand tingling, itching- R 5. SEVERITY: If symptoms are present, ask "Are they mild, moderate or severe?"     Severe- diarrhea 6.  PREGNANCY:  "Is there any chance that you are pregnant?" "When was your last menstrual period?"     n/a  Protocols used: DIARRHEA-A-AH, MEDICATION QUESTION CALL-A-AH

## 2020-05-25 ENCOUNTER — Encounter (HOSPITAL_COMMUNITY): Payer: Self-pay | Admitting: Pharmacy Technician

## 2020-05-25 ENCOUNTER — Emergency Department (HOSPITAL_COMMUNITY): Payer: Self-pay

## 2020-05-25 ENCOUNTER — Inpatient Hospital Stay (HOSPITAL_COMMUNITY)
Admission: EM | Admit: 2020-05-25 | Discharge: 2020-05-30 | DRG: 872 | Disposition: A | Discharge status: Home / Self Care | Payer: Self-pay | Attending: Obstetrics and Gynecology | Admitting: Obstetrics and Gynecology

## 2020-05-25 ENCOUNTER — Other Ambulatory Visit: Payer: Self-pay

## 2020-05-25 DIAGNOSIS — R195 Other fecal abnormalities: Secondary | ICD-10-CM | POA: Diagnosis present

## 2020-05-25 DIAGNOSIS — K219 Gastro-esophageal reflux disease without esophagitis: Secondary | ICD-10-CM | POA: Diagnosis present

## 2020-05-25 DIAGNOSIS — F17210 Nicotine dependence, cigarettes, uncomplicated: Secondary | ICD-10-CM

## 2020-05-25 DIAGNOSIS — Z7952 Long term (current) use of systemic steroids: Secondary | ICD-10-CM

## 2020-05-25 DIAGNOSIS — N3000 Acute cystitis without hematuria: Secondary | ICD-10-CM | POA: Diagnosis present

## 2020-05-25 DIAGNOSIS — N950 Postmenopausal bleeding: Secondary | ICD-10-CM | POA: Diagnosis present

## 2020-05-25 DIAGNOSIS — Z20822 Contact with and (suspected) exposure to covid-19: Secondary | ICD-10-CM | POA: Diagnosis present

## 2020-05-25 DIAGNOSIS — R748 Abnormal levels of other serum enzymes: Secondary | ICD-10-CM | POA: Diagnosis present

## 2020-05-25 DIAGNOSIS — R7303 Prediabetes: Secondary | ICD-10-CM | POA: Diagnosis present

## 2020-05-25 DIAGNOSIS — Z880 Allergy status to penicillin: Secondary | ICD-10-CM

## 2020-05-25 DIAGNOSIS — I700 Atherosclerosis of aorta: Secondary | ICD-10-CM

## 2020-05-25 DIAGNOSIS — Z885 Allergy status to narcotic agent status: Secondary | ICD-10-CM

## 2020-05-25 DIAGNOSIS — R160 Hepatomegaly, not elsewhere classified: Secondary | ICD-10-CM

## 2020-05-25 DIAGNOSIS — N7093 Salpingitis and oophoritis, unspecified: Secondary | ICD-10-CM | POA: Diagnosis present

## 2020-05-25 DIAGNOSIS — Z79899 Other long term (current) drug therapy: Secondary | ICD-10-CM

## 2020-05-25 DIAGNOSIS — A419 Sepsis, unspecified organism: Principal | ICD-10-CM | POA: Diagnosis present

## 2020-05-25 DIAGNOSIS — R7401 Elevation of levels of liver transaminase levels: Secondary | ICD-10-CM | POA: Diagnosis present

## 2020-05-25 LAB — LACTIC ACID, PLASMA
Lactic Acid, Venous: 6.2 mmol/L (ref 0.5–1.9)
Lactic Acid, Venous: 2 mmol/L (ref 0.5–1.9)
Lactic Acid, Venous: 1.7 mmol/L (ref 0.5–1.9)

## 2020-05-25 LAB — CBC WITH DIFFERENTIAL/PLATELET
Abs Immature Granulocytes: 0.1 10*3/uL — ABNORMAL HIGH (ref 0.00–0.07)
Basophils Absolute: 0 10*3/uL (ref 0.0–0.1)
Basophils Relative: 0 %
Eosinophils Absolute: 0 10*3/uL (ref 0.0–0.5)
Eosinophils Relative: 0 %
HCT: 38.5 % (ref 36.0–46.0)
Hemoglobin: 12.5 g/dL (ref 12.0–15.0)
Lymphocytes Relative: 2 %
Lymphs Abs: 0.2 10*3/uL — ABNORMAL LOW (ref 0.7–4.0)
MCH: 30.2 pg (ref 26.0–34.0)
MCHC: 32.5 g/dL (ref 30.0–36.0)
MCV: 93 fL (ref 80.0–100.0)
Metamyelocytes Relative: 1 %
Monocytes Absolute: 0.1 10*3/uL (ref 0.1–1.0)
Monocytes Relative: 1 %
Neutro Abs: 8 10*3/uL — ABNORMAL HIGH (ref 1.7–7.7)
Neutrophils Relative %: 96 %
Platelets: 573 10*3/uL — ABNORMAL HIGH (ref 150–400)
RBC: 4.14 MIL/uL (ref 3.87–5.11)
RDW: 14.3 % (ref 11.5–15.5)
WBC: 8.3 10*3/uL (ref 4.0–10.5)
nRBC: 0 % (ref 0.0–0.2)
nRBC: 0 /100 WBC

## 2020-05-25 LAB — URINALYSIS, ROUTINE W REFLEX MICROSCOPIC
Bilirubin Urine: NEGATIVE
Glucose, UA: NEGATIVE mg/dL
Ketones, ur: NEGATIVE mg/dL
Nitrite: NEGATIVE
Protein, ur: NEGATIVE mg/dL
Specific Gravity, Urine: 1.006 (ref 1.005–1.030)
WBC, UA: >50 WBC/hpf — ABNORMAL HIGH (ref 0–5)
pH: 7 (ref 5.0–8.0)

## 2020-05-25 LAB — TYPE AND SCREEN
ABO/RH(D): O NEG
Antibody Screen: NEGATIVE

## 2020-05-25 LAB — COMPREHENSIVE METABOLIC PANEL
ALT: 23 U/L (ref 0–44)
AST: 120 U/L — ABNORMAL HIGH (ref 15–41)
Albumin: 2.2 g/dL — ABNORMAL LOW (ref 3.5–5.0)
Alkaline Phosphatase: 259 U/L — ABNORMAL HIGH (ref 38–126)
Anion gap: 14 (ref 5–15)
BUN: 8 mg/dL (ref 6–20)
CO2: 20 mmol/L — ABNORMAL LOW (ref 22–32)
Calcium: 8.3 mg/dL — ABNORMAL LOW (ref 8.9–10.3)
Chloride: 102 mmol/L (ref 98–111)
Creatinine, Ser: 1.23 mg/dL — ABNORMAL HIGH (ref 0.44–1.00)
GFR calc Af Amer: 60 mL/min — ABNORMAL LOW (ref >60)
GFR calc non Af Amer: 51 mL/min — ABNORMAL LOW (ref >60)
Glucose, Bld: 197 mg/dL — ABNORMAL HIGH (ref 70–99)
Potassium: 3.3 mmol/L — ABNORMAL LOW (ref 3.5–5.1)
Sodium: 136 mmol/L (ref 135–145)
Total Bilirubin: 0.5 mg/dL (ref 0.3–1.2)
Total Protein: 5.7 g/dL — ABNORMAL LOW (ref 6.5–8.1)

## 2020-05-25 LAB — I-STAT BETA HCG BLOOD, ED (MC, WL, AP ONLY)
I-stat hCG, quantitative: <5 m[IU]/mL (ref <5)
I-stat hCG, quantitative: <5 m[IU]/mL (ref <5)

## 2020-05-25 LAB — APTT: aPTT: 29 seconds (ref 24–36)

## 2020-05-25 LAB — SARS CORONAVIRUS 2 BY RT PCR (HOSPITAL ORDER, PERFORMED IN ~~LOC~~ HOSPITAL LAB): SARS Coronavirus 2: NEGATIVE

## 2020-05-25 LAB — PROTIME-INR
INR: 1.2 (ref 0.8–1.2)
Prothrombin Time: 14.9 seconds (ref 11.4–15.2)

## 2020-05-25 LAB — HEMOGLOBIN A1C
Hgb A1c MFr Bld: 6.1 % — ABNORMAL HIGH (ref 4.8–5.6)
Mean Plasma Glucose: 128.37 mg/dL

## 2020-05-25 LAB — HIV ANTIBODY (ROUTINE TESTING W REFLEX): HIV Screen 4th Generation wRfx: NONREACTIVE

## 2020-05-25 LAB — GLUCOSE, CAPILLARY: Glucose-Capillary: 114 mg/dL — ABNORMAL HIGH (ref 70–99)

## 2020-05-25 LAB — ABO/RH: ABO/RH(D): O NEG

## 2020-05-25 MED ORDER — SIMETHICONE 80 MG PO CHEW: 80.0000 mg | CHEWABLE_TABLET | Freq: Four times a day (QID) | ORAL | Status: DC | PRN

## 2020-05-25 MED ORDER — SODIUM CHLORIDE 0.9 % IV BOLUS (SEPSIS)
1000.0000 mL | Freq: Once | INTRAVENOUS | Status: AC
  Administered 2020-05-25: 1000 mL via INTRAVENOUS

## 2020-05-25 MED ORDER — PIPERACILLIN-TAZOBACTAM 3.375 G IVPB
3.3750 g | Freq: Three times a day (TID) | INTRAVENOUS | Status: DC
  Administered 2020-05-25 – 2020-05-28 (×9): 3.375 g via INTRAVENOUS
  Filled 2020-05-25 (×9): qty 50

## 2020-05-25 MED ORDER — OXYCODONE HCL 5 MG PO TABS
5.0000 mg | ORAL_TABLET | Freq: Four times a day (QID) | ORAL | Status: DC | PRN
  Administered 2020-05-25: 5 mg via ORAL
  Administered 2020-05-25 – 2020-05-26 (×2): 10 mg via ORAL
  Filled 2020-05-25 (×3): qty 2

## 2020-05-25 MED ORDER — PRENATAL MULTIVITAMIN CH
1.0000 | ORAL_TABLET | Freq: Every day | ORAL | Status: DC
  Administered 2020-05-26 – 2020-05-28 (×3): 1 via ORAL
  Filled 2020-05-25 (×7): qty 1

## 2020-05-25 MED ORDER — IOHEXOL 300 MG/ML  SOLN
100.0000 mL | Freq: Once | INTRAMUSCULAR | Status: AC | PRN
  Administered 2020-05-25: 100 mL via INTRAVENOUS

## 2020-05-25 MED ORDER — ONDANSETRON HCL 4 MG PO TABS
4.0000 mg | ORAL_TABLET | Freq: Four times a day (QID) | ORAL | Status: DC | PRN
  Administered 2020-05-26 – 2020-05-29 (×4): 4 mg via ORAL
  Filled 2020-05-25 (×4): qty 1

## 2020-05-25 MED ORDER — ONDANSETRON HCL 4 MG/2ML IJ SOLN
4.0000 mg | Freq: Once | INTRAMUSCULAR | Status: AC
  Administered 2020-05-25: 4 mg via INTRAVENOUS
  Filled 2020-05-25: qty 2

## 2020-05-25 MED ORDER — POLYETHYLENE GLYCOL 3350 17 G PO PACK
17.0000 g | PACK | Freq: Every day | ORAL | Status: DC
  Administered 2020-05-27 – 2020-05-28 (×2): 17 g via ORAL
  Filled 2020-05-25 (×3): qty 1

## 2020-05-25 MED ORDER — SODIUM CHLORIDE 0.9 % IV BOLUS
1500.0000 mL | Freq: Once | INTRAVENOUS | Status: AC
  Administered 2020-05-25: 1500 mL via INTRAVENOUS

## 2020-05-25 MED ORDER — ENOXAPARIN SODIUM 40 MG/0.4ML ~~LOC~~ SOLN
40.0000 mg | SUBCUTANEOUS | Status: DC
  Administered 2020-05-25 – 2020-05-27 (×3): 40 mg via SUBCUTANEOUS
  Filled 2020-05-25 (×3): qty 0.4

## 2020-05-25 MED ORDER — ACETAMINOPHEN 500 MG PO TABS
1000.0000 mg | ORAL_TABLET | Freq: Once | ORAL | Status: AC
  Administered 2020-05-25: 1000 mg via ORAL
  Filled 2020-05-25: qty 2

## 2020-05-25 MED ORDER — ONDANSETRON HCL 4 MG/2ML IJ SOLN
4.0000 mg | Freq: Four times a day (QID) | INTRAMUSCULAR | Status: DC | PRN
  Administered 2020-05-25: 4 mg via INTRAVENOUS
  Filled 2020-05-25: qty 2

## 2020-05-25 MED ORDER — SODIUM CHLORIDE 0.9 % IV BOLUS
500.0000 mL | Freq: Once | INTRAVENOUS | Status: AC
  Administered 2020-05-25: 500 mL via INTRAVENOUS

## 2020-05-25 MED ORDER — FENTANYL CITRATE (PF) 100 MCG/2ML IJ SOLN
50.0000 ug | Freq: Once | INTRAMUSCULAR | Status: AC
  Administered 2020-05-25: 50 ug via INTRAVENOUS
  Filled 2020-05-25: qty 2

## 2020-05-25 MED ORDER — LACTATED RINGERS IV SOLN: INTRAVENOUS | Status: AC

## 2020-05-25 MED ORDER — PIPERACILLIN-TAZOBACTAM 3.375 G IVPB 30 MIN
3.3750 g | Freq: Once | INTRAVENOUS | Status: AC
  Administered 2020-05-25: 3.375 g via INTRAVENOUS
  Filled 2020-05-25: qty 50

## 2020-05-25 NOTE — ED Provider Notes (Signed)
[facility] EMERGENCY DEPARTMENT Provider Note   CSN: [id number] Arrival date & time: 05/25/20  3235     History Chief Complaint  Patient presents with  . Abdominal Pain  . Fever    [name] is a 49 y.o. female.  The history is provided by the patient.  Abdominal Pain Pain location:  LLQ and RLQ Pain quality: aching and cramping   Pain radiates to:  Does not radiate Pain severity:  Moderate Onset quality:  Gradual Timing:  Constant Progression:  Unchanged Chronicity:  New Context: not sick contacts and not suspicious food intake   Relieved by:  Nothing Worsened by:  Nothing Ineffective treatments:  OTC medications and NSAIDs Associated symptoms: chills, diarrhea, fever, nausea and vomiting   Associated symptoms: no anorexia, no chest pain, no constipation, no cough, no dysuria, no fatigue, no hematemesis, no hematochezia, no hematuria, no shortness of breath, no sore throat and no vaginal discharge   Risk factors: has not had multiple surgeries        Past Medical History:  Diagnosis Date  . Abnormal Pap smear of cervix   . Anemia   . Anxiety   . Bacterial vaginosis   . Depression   . GERD (gastroesophageal reflux disease)   . Headache     Patient Active Problem List   Diagnosis Date Noted  . Adenomyosis 07/15/2015  . ETOH abuse 07/14/2015  . Abnormal uterine bleeding (AUB) 07/14/2015    Past Surgical History:  Procedure Laterality Date  . abscess       OB History    Gravida  4   Para  3   Term  3   Preterm  0   AB  1   Living  3     SAB  0   TAB  0   Ectopic  0   Multiple      Live Births              No family history on file.  Social History   Tobacco Use  . Smoking status: Current Every Day Smoker    Packs/day: 2.00    Types: Cigarettes  . Smokeless tobacco: Never Used  Substance Use Topics  . Alcohol use: Yes    Comment: drinks daily, 2- 40oz today   . Drug use: No    Home  Medications Prior to Admission medications   Medication Sig Start Date End Date Taking? Authorizing Provider  ferrous sulfate 325 (65 FE) MG tablet Take 1 tablet (325 mg total) by mouth daily with breakfast. Reported on 11/30/2015 08/22/19   Fayrene Helper, PA-C  methocarbamol (ROBAXIN) 500 MG tablet Take 1 tablet (500 mg total) by mouth 2 (two) times daily. 10/24/16   Rolland Porter, MD  predniSONE (DELTASONE) 20 MG tablet Take 1 tablet (20 mg total) by mouth 2 (two) times daily with a meal. 10/24/16   Rolland Porter, MD    Allergies    Penicillins and Morphine and related  Review of Systems   Review of Systems  Constitutional: Positive for chills and fever. Negative for fatigue.  HENT: Negative for ear pain and sore throat.   Eyes: Negative for pain and visual disturbance.  Respiratory: Negative for cough and shortness of breath.   Cardiovascular: Negative for chest pain and palpitations.  Gastrointestinal: Positive for abdominal pain, diarrhea, nausea and vomiting. Negative for anorexia, constipation, hematemesis and hematochezia.  Genitourinary: Negative for dysuria, hematuria and vaginal discharge.  Musculoskeletal: Negative for arthralgias and  back pain.  Skin: Negative for color change and rash.  Neurological: Negative for seizures and syncope.  All other systems reviewed and are negative.   Physical Exam Updated Vital Signs  ED Triage Vitals  Enc Vitals Group     BP 05/25/20 0845 (!) 155/74     Pulse Rate 05/25/20 0845 (!) 133     Resp 05/25/20 0845 (!) 26     Temp 05/25/20 0845 (!) 102.9 F (39.4 C)     Temp Source 05/25/20 0845 Oral     SpO2 05/25/20 0845 97 %     Weight 05/25/20 0847 125 lb (56.7 kg)     Height 05/25/20 0847 5\' 1"  (1.549 m)     Head Circumference --      Peak Flow --      Pain Score 05/25/20 0847 7     Pain Loc --      Pain Edu? --      Excl. in [place]? --     Physical Exam Vitals and nursing note reviewed.  Constitutional:      General: She is not in  acute distress.    Appearance: She is well-developed. She is not ill-appearing.  HENT:     Head: Normocephalic and atraumatic.  Eyes:     Extraocular Movements: Extraocular movements intact.     Conjunctiva/sclera: Conjunctivae normal.  Cardiovascular:     Rate and Rhythm: Regular rhythm. Tachycardia present.     Heart sounds: Normal heart sounds. No murmur heard.   Pulmonary:     Effort: Pulmonary effort is normal. No respiratory distress.     Breath sounds: Normal breath sounds.  Abdominal:     General: There is no distension.     Palpations: Abdomen is soft.     Tenderness: There is abdominal tenderness in the right lower quadrant, suprapubic area and left lower quadrant. There is no guarding or rebound.  Musculoskeletal:     Cervical back: Neck supple.  Skin:    General: Skin is warm and dry.     Capillary Refill: Capillary refill takes less than 2 seconds.  Neurological:     General: No focal deficit present.     Mental Status: She is alert.     ED Results / Procedures / Treatments   Labs (all labs ordered are listed, but only abnormal results are displayed) Labs Reviewed  LACTIC ACID, PLASMA - Abnormal; Notable for the following components:      Result Value   Lactic Acid, Venous 6.2 (*)    All other components within normal limits  LACTIC ACID, PLASMA - Abnormal; Notable for the following components:   Lactic Acid, Venous 2.0 (*)    All other components within normal limits  COMPREHENSIVE METABOLIC PANEL - Abnormal; Notable for the following components:   Potassium 3.3 (*)    CO2 20 (*)    Glucose, Bld 197 (*)    Creatinine, Ser 1.23 (*)    Calcium 8.3 (*)    Total Protein 5.7 (*)    Albumin 2.2 (*)    AST 120 (*)    Alkaline Phosphatase 259 (*)    GFR calc non Af Amer 51 (*)    GFR calc Af Amer 60 (*)    All other components within normal limits  CBC WITH DIFFERENTIAL/PLATELET - Abnormal; Notable for the following components:   Platelets 573 (*)     Neutro Abs 8.0 (*)    Lymphs Abs 0.2 (*)    Abs  Immature Granulocytes 0.10 (*)    All other components within normal limits  URINALYSIS, ROUTINE W REFLEX MICROSCOPIC - Abnormal; Notable for the following components:   APPearance CLOUDY (*)    Hgb urine dipstick MODERATE (*)    Leukocytes,Ua LARGE (*)    WBC, UA >50 (*)    Bacteria, UA MANY (*)    All other components within normal limits  SARS CORONAVIRUS 2 BY RT PCR (HOSPITAL ORDER, PERFORMED IN [facility])  CULTURE, BLOOD (ROUTINE X 2)  CULTURE, BLOOD (ROUTINE X 2)  URINE CULTURE  APTT  PROTIME-INR  I-STAT BETA HCG BLOOD, ED ([facility], [facility], [facility] ONLY)  I-STAT BETA HCG BLOOD, ED ([facility], [facility], [facility] ONLY)    EKG EKG Interpretation  Date/Time:  Monday May 25 2020 08:57:24 EDT Ventricular Rate:  122 PR Interval:    QRS Duration: 79 QT Interval:  303 QTC Calculation: 432 R Axis:   81 Text Interpretation: Sinus tachycardia Confirmed by Virgina Norfolk [id number]) on 05/25/2020 9:20:39 AM   Radiology CT ABDOMEN PELVIS W CONTRAST  Result Date: 05/25/2020 CLINICAL DATA:  Lower abdominal pain for 5 days. EXAM: CT ABDOMEN AND PELVIS WITH CONTRAST TECHNIQUE: Multidetector CT imaging of the abdomen and pelvis was performed using the standard protocol following bolus administration of intravenous contrast. CONTRAST:  OMNIPAQUE IOHEXOL 300 MG/ML  SOLN COMPARISON:  CT scan of the abdomen and pelvis dated 12/23/2009 FINDINGS: Lower chest: No acute abnormality. Hepatobiliary: No focal liver abnormality is seen. Hepatomegaly. No gallstones, gallbladder wall thickening, or biliary dilatation. Pancreas: Unremarkable. No pancreatic ductal dilatation or surrounding inflammatory changes. Spleen: Normal in size without focal abnormality. Adrenals/Urinary Tract: Adrenal glands are unremarkable. Kidneys are normal, without renal calculi, focal lesion, or hydronephrosis. Bladder is unremarkable. Stomach/Bowel: No dilated loops of large or small bowel.  Prominent mucosa in the fundus of the stomach. There are few diverticula in the ascending colon. Slight prominence of the mucosa of the terminal ileum. Appendix is normal. Vascular/Lymphatic: Aortic atherosclerosis. No enlarged abdominal or pelvic lymph nodes. Reproductive: The patient has a left tubo-ovarian abscess and a right pyosalpinx. The uterus is normal. Other: Small amount of free fluid in the pelvic cul-de-sac. Musculoskeletal: No acute or significant osseous findings. IMPRESSION: 1. Left tubal ovarian abscess. Right pyosalpinx. 2. Hepatomegaly. 3. Aortic atherosclerosis. Aortic Atherosclerosis (ICD10-I70.0). Electronically Signed   By: Francene Boyers M.D.   On: 05/25/2020 12:32   DG Chest Port 1 View  Result Date: 05/25/2020 CLINICAL DATA:  Fever today. EXAM: PORTABLE CHEST 1 VIEW COMPARISON:  10/24/2016 FINDINGS: The heart size and mediastinal contours are within normal limits. Both lungs are clear. The visualized skeletal structures are unremarkable. IMPRESSION: No acute cardiopulmonary findings. Electronically Signed   By: Rudie Meyer M.D.   On: 05/25/2020 09:26    Procedures .Critical Care Performed by: Virgina Norfolk, DO Authorized by: Virgina Norfolk, DO   Critical care provider statement:    Critical care time (minutes):  40   Critical care was necessary to treat or prevent imminent or life-threatening deterioration of the following conditions:  Sepsis   Critical care was time spent personally by me on the following activities:  Blood draw for specimens, development of treatment plan with patient or surrogate, discussions with primary provider, evaluation of patient's response to treatment, examination of patient, obtaining history from patient or surrogate, ordering and performing treatments and interventions, ordering and review of laboratory studies, ordering and review of radiographic studies, pulse oximetry, re-evaluation of patient's condition and review of  old charts   I  assumed direction of critical care for this patient from another provider in my specialty: no     (including critical care time)  Medications Ordered in ED Medications  fentaNYL (SUBLIMAZE) injection 50 mcg (has no administration in time range)  sodium chloride 0.9 % bolus 1,000 mL (0 mLs Intravenous Stopped 05/25/20 1127)  acetaminophen (TYLENOL) tablet 1,000 mg (1,000 mg Oral Given 05/25/20 0906)  piperacillin-tazobactam (ZOSYN) IVPB 3.375 g (0 g Intravenous Stopped 05/25/20 1017)  sodium chloride 0.9 % bolus 1,500 mL (1,500 mLs Intravenous New Bag/Given 05/25/20 1234)  ondansetron (ZOFRAN) injection 4 mg (4 mg Intravenous Given 05/25/20 0938)  sodium chloride 0.9 % bolus 500 mL (500 mLs Intravenous New Bag/Given 05/25/20 1235)  iohexol (OMNIPAQUE) 300 MG/ML solution 100 mL (100 mLs Intravenous Contrast Given 05/25/20 1214)    ED Course  I have reviewed the triage vital signs and the nursing notes.  Pertinent labs & imaging results that were available during my care of the patient were reviewed by me and considered in my medical decision making (see chart for details).    MDM Rules/Calculators/A&P                          [name] is a 49 year old female with history of anxiety, depression who presents to the ED with abdominal pain, fever.  Patient states nausea, vomiting, diarrhea, feverish symptoms for the last 5 days.  Has been taking Motrin.  Woke up this morning with severe body aches.  Denies any bloody bowel movements or hematemesis.  No vaginal discharge.  Did have what she thought was maybe a menstrual cycle a week or 2 ago but does not have any active vaginal bleeding.  No cough or sputum production.  No pain with urination.  Mostly lower abdominal cramping.  No focal tenderness on exam appears to be in the right and left lower quadrants and suprapubic area.  Patient is tachycardic, febrile.  Concern for sepsis likely from intra-abdominal source.  Sepsis work-up initiated.  Will  give IV fluid bolus while awaiting lactic acid.  Will give IV Zosyn.  Patient given Tylenol.  EKG shows sinus tachycardia.  No ischemic changes.  Lactic acid is 6.2.  Will give 30 cc/kg IV fluids.  Awaiting remaining lab work.  Will get CT scan of abdomen pelvis to evaluate for intra-abdominal process such as appendicitis or colitis.  Lactic acid has improved to 2.  CT scan actually shows a left tubo-ovarian abscess and a right hydrosalpinx.  Talked with on-call obstetrics who will admit the patient for further care.  We will continue IV Zosyn.  Patient given IV fentanyl for pain.  Hemodynamically she is stable.  Patient appears to be responding well to IV fluids.  Mentation is well.  No evidence of shock at this time.  Admitted in stable condition.  This chart was dictated using voice recognition software.  Despite best efforts to proofread,  errors can occur which can change the documentation meaning.    Final Clinical Impression(s) / ED Diagnoses Final diagnoses:  Sepsis, due to unspecified organism, unspecified whether acute organ dysfunction present [facility])  Acute cystitis without hematuria  TOA (tubo-ovarian abscess)    Rx / DC Orders ED Discharge Orders    None       Virgina Norfolk, DO 05/25/20 1252

## 2020-05-25 NOTE — Progress Notes (Signed)
Transferred from ED to room 6N24 assisted into bed x 1 person stand by assist. Alert and oriented able to make needs known. Assessment completed. Safety measures in place, oriented patient to staff and room. Husband at bedside discuss plan of care no concerns at this time. Call bell at reach

## 2020-05-25 NOTE — Progress Notes (Signed)
Pharmacy Antibiotic/Anticoagulant Note  [name] is a 49 y.o. female admitted on 05/25/2020 with TOA.  Pharmacy has been consulted for Zosyn dosing. Pharmacy has also been asked to dose Lovenox for VTE ppx.    Height: 5\' 1"  (154.9 cm) Weight: 56.7 kg (125 lb) IBW/kg (Calculated) : 47.8  Temp (24hrs), Avg:100.9 F (38.3 C), Min:98.9 F (37.2 C), Max:102.9 F (39.4 C)  Recent Labs  Lab 05/25/20 0849 05/25/20 1133  WBC 8.3  --   CREATININE 1.23*  --   LATICACIDVEN 6.2* 2.0*    Estimated Creatinine Clearance: 41.7 mL/min (A) (by C-G formula based on SCr of 1.23 mg/dL (H)).    Allergies  Allergen Reactions  . Penicillins Other (See Comments)    Childhood reaction  . Morphine And Related Itching and Rash    Antimicrobials this admission: 6/28 Zosyn >>   Dose adjustments this admission: N/a  Microbiology results: Pending   Plan:  - Zosyn 3.375g IV x 1 dose over 30 min followed by Zosyn 3.375g IV every 8 hours infused over 4 hours  - For VTE ppx will start Lovenox 40mg  SubQ q24h as CrCl is > 30  - Monitor patients renal function and urine output  - De-escalate ABX when appropriate  - With CrCl being > 80ml/min at this time Pharmacy will signoff formally at this time and follow from the background - Please feel free to re-consult for additional needs.   Thank you for allowing pharmacy to be a part of this patient's care.  PharmD. BCPS 05/25/2020 1:12 PM

## 2020-05-25 NOTE — ED Triage Notes (Signed)
Pt arrives pov with reports of fever, nvd, malaise, abdominal pain, back pain onset Wednesday. Pt tachycardic and febrile.

## 2020-05-25 NOTE — H&P (Signed)
Obstetrics & Gynecology H&P   Date of Admission: 05/25/2020   Requesting Provider: [facility]  Primary OBGYN: None Primary Care Provider: None  Reason for Admission: abdominal pain, fevers, left sided toa  History of Present Illness: [name] is a 49 y.o. [id number] (No LMP recorded. (Menstrual status: Perimenopausal).), with the above CC.    Patient states her periods stopped about 1.5 years ago. She noted pain about a week ago and then had a period that was normal, 1wk with pain persisting after that caused her to come in for evaluation.   ED work up showed her to have SIRS criteria that improved with abx and IVF. CT scan showed left sided TOA  No vaginal bleeding, discharge. Pt hungry  ROS: A 12-point review of systems was performed and negative, except as stated in the above HPI.  OBGYN History: As per HPI. OB History  Gravida Para Term Preterm AB Living  4 3 3  0 1 3  SAB TAB Ectopic Multiple Live Births  0 0 0        # Outcome Date GA Lbr Len/2nd Weight Sex Delivery Anes PTL Lv  4 Term           3 Term           2 Term           1 AB             Obstetric Comments  Vag delivery x 3    Periods: patient states her periods stopped about 1.5 years ago. She noted pain about a week ago and then had a period that was normal, 1wk with pain persisting after that caused her to come in for evaluation.  History of pap smears: Yes. Last pap smear 2015   Past Medical History: Past Medical History:  Diagnosis Date   Abnormal Pap smear of cervix    Anemia    Anxiety    Bacterial vaginosis    Depression    GERD (gastroesophageal reflux disease)    Headache     Past Surgical History: Past Surgical History:  Procedure Laterality Date   abscess      Family History:  No family history on file.  Social History:  Social History   Socioeconomic History   Marital status: Married    Spouse name: Not on file   Number of children: Not on file   Years of  education: Not on file   Highest education level: Not on file  Occupational History   Not on file  Tobacco Use   Smoking status: Current Every Day Smoker    Packs/day: 2.00    Types: Cigarettes   Smokeless tobacco: Never Used  Substance and Sexual Activity   Alcohol use: Yes    Comment: drinks daily, 2- 40oz today    Drug use: No   Sexual activity: Yes    Birth control/protection: None  Other Topics Concern   Not on file  Social History Narrative   ** Merged History Encounter **       Social Determinants of Health   Financial Resource Strain:    Difficulty of Paying Living Expenses:   Food Insecurity:    Worried About 2016 in the Last Year:    Programme researcher, broadcasting/film/video in the Last Year:   Transportation Needs:    Barista (Medical):    Lack of Transportation (Non-Medical):   Physical Activity:    Days of Exercise  per Week:    Minutes of Exercise per Session:   Stress:    Feeling of Stress :   Social Connections:    Frequency of Communication with Friends and Family:    Frequency of Social Gatherings with Friends and Family:    Attends Religious Services:    Active Member of Clubs or Organizations:    Attends Banker Meetings:    Marital Status:   Intimate Partner Violence:    Fear of Current or Ex-Partner:    Emotionally Abused:    Physically Abused:    Sexually Abused:      Allergy: Allergies  Allergen Reactions   Penicillins Other (See Comments)    Childhood reaction   Morphine And Related Itching and Rash    Current Outpatient Medications: None   Hospital Medications: Current Facility-Administered Medications  Medication Dose Route Frequency Provider Last Rate Last Admin   enoxaparin (LOVENOX) injection 40 mg  40 mg Subcutaneous Q24H Joaquim Lai, RPH       piperacillin-tazobactam (ZOSYN) IVPB 3.375 g  3.375 g Intravenous Q8H Joaquim Lai, [facility]        Physical Exam:  Current Vital  Signs 24h Vital Sign Ranges  T 98.9 F (37.2 C) Temp  Avg: 100.9 F (38.3 C)  Min: 98.9 F (37.2 C)  Max: 102.9 F (39.4 C)  BP 99/68 BP  Min: 88/57  Max: 155/74  HR 92 Pulse  Avg: 106.7  Min: 92  Max: 133  RR (!) 21 Resp  Avg: 21.8  Min: 17  Max: 26  SaO2 98 % Room Air SpO2  Avg: 97.7 %  Min: 96 %  Max: 99 %       24 Hour I/O Current Shift I/O  Time Ins Outs No intake/output data recorded. No intake/output data recorded.   Patient Vitals for the past 24 hrs:  BP Temp Temp src Pulse Resp SpO2 Height Weight  05/25/20 1245 99/68 -- -- 92 (!) 21 98 % -- --  05/25/20 1215 98/85 -- -- 96 20 97 % -- --  05/25/20 1156 -- 98.9 F (37.2 C) Oral -- -- -- -- --  05/25/20 1100 (!) 97/55 -- -- 98 (!) 21 96 % -- --  05/25/20 1045 (!) 88/57 -- -- (!) 102 (!) 26 96 % -- --  05/25/20 1015 92/60 -- -- 100 (!) 21 97 % -- --  05/25/20 1000 102/71 -- -- (!) 105 17 99 % -- --  05/25/20 0945 107/77 -- -- (!) 106 20 99 % -- --  05/25/20 0930 116/83 -- -- (!) 113 (!) 26 99 % -- --  05/25/20 0900 106/77 -- -- (!) 122 20 99 % -- --  05/25/20 0847 -- -- -- -- -- -- 5\' 1"  (1.549 m) 56.7 kg  05/25/20 0845 (!) 155/74 (!) 102.9 F (39.4 C) Oral (!) 133 (!) 26 97 % -- --    Body mass index is 23.62 kg/m. General appearance: Well nourished, well developed female in no acute distress.  Cardiovascular: S1, S2 normal, no murmur, rub or gallop, regular rate and rhythm Respiratory:  Clear to auscultation bilateral. Normal respiratory effort Abdomen: positive bowel sounds and no masses, hernias; diffusely non tender to palpation, non distended Neuro/Psych:  Normal mood and affect.  Skin:  Warm and dry.  Extremities: no clubbing, cyanosis, or edema.   Laboratory: Negative: beta hcg, PT/PTT Pending: UCx, BCx x 2 Lactic acid: 2 from 6.0  Recent Labs  Lab 05/25/20 0849  WBC 8.3  HGB 12.5  HCT 38.5  PLT 573*   Recent Labs  Lab 05/25/20 0849  NA 136  K 3.3*  CL 102  CO2 20*  BUN 8  CREATININE  1.23*  CALCIUM 8.3*  PROT 5.7*  BILITOT 0.5  ALKPHOS 259*  ALT 23  AST 120*  GLUCOSE 197*    Imaging:  CLINICAL DATA:  Lower abdominal pain for 5 days.  EXAM: CT ABDOMEN AND PELVIS WITH CONTRAST  TECHNIQUE: Multidetector CT imaging of the abdomen and pelvis was performed using the standard protocol following bolus administration of intravenous contrast.  CONTRAST:  115mL OMNIPAQUE IOHEXOL 300 MG/ML  SOLN  COMPARISON:  CT scan of the abdomen and pelvis dated 12/23/2009  FINDINGS: Lower chest: No acute abnormality.  Hepatobiliary: No focal liver abnormality is seen. Hepatomegaly. No gallstones, gallbladder wall thickening, or biliary dilatation.  Pancreas: Unremarkable. No pancreatic ductal dilatation or surrounding inflammatory changes.  Spleen: Normal in size without focal abnormality.  Adrenals/Urinary Tract: Adrenal glands are unremarkable. Kidneys are normal, without renal calculi, focal lesion, or hydronephrosis. Bladder is unremarkable.  Stomach/Bowel: No dilated loops of large or small bowel. Prominent mucosa in the fundus of the stomach. There are few diverticula in the ascending colon. Slight prominence of the mucosa of the terminal ileum. Appendix is normal.  Vascular/Lymphatic: Aortic atherosclerosis. No enlarged abdominal or pelvic lymph nodes.  Reproductive: The patient has a left tubo-ovarian abscess and a right pyosalpinx. The uterus is normal.  Other: Small amount of free fluid in the pelvic cul-de-sac.  Musculoskeletal: No acute or significant osseous findings.  IMPRESSION: 1. Left tubal ovarian abscess. Right pyosalpinx. 2. Hepatomegaly. 3. Aortic atherosclerosis.  Aortic Atherosclerosis (ICD10-I70.0).   Electronically Signed   By: Lorriane Shire M.D.   On: 05/25/2020 12:32  Assessment: [name] is a 49 y.o. [phone] with left TOA, SIRs. Pt improving  Plan:  *GYN: Admit to GYN. D/w her need for outpatient work  up for postmenopausal bleeding, pap smear *ID: continue zosyn. I d/w radiology and left side appears to be 5-6cm TOA that looks like it may be difficult to drain. and right side appears to be a thin pyosalpinx. Pt improving. if worsens, can formally consult IR for evaluation for possible drainage. Follow up cultures. Repeat LA this afternoon. GC/CT, rpr, hiv ordered *?diabetes: a1c ordered and achs checks *FEN/GI: MIVF, DM2 diet. Will need outpatient GI referral if labs still abnormal at discharge *PPx: lovenox, oob ad lib *Pain: PO PRNs *Dispo: when doing well on PO abx   Total time taking care of the patient was 35 minutes, with greater than 50% of the time spent in face to face interaction with the patient.  Durene Romans MD [facility]ending [facility] GYN Consult Phone: [phone] (M-F, [phone]) & [phone] (Off hours, weekends, holidays)

## 2020-05-25 NOTE — ED Notes (Signed)
Pt ambulatory to the restroom without difficulty.

## 2020-05-26 DIAGNOSIS — R7303 Prediabetes: Secondary | ICD-10-CM | POA: Diagnosis present

## 2020-05-26 DIAGNOSIS — R195 Other fecal abnormalities: Secondary | ICD-10-CM | POA: Diagnosis present

## 2020-05-26 LAB — BLOOD CULTURE ID PANEL (REFLEXED)
Acinetobacter baumannii: NOT DETECTED
Acinetobacter baumannii: NOT DETECTED
Candida albicans: NOT DETECTED
Candida albicans: NOT DETECTED
Candida glabrata: NOT DETECTED
Candida glabrata: NOT DETECTED
Candida krusei: NOT DETECTED
Candida krusei: NOT DETECTED
Candida parapsilosis: NOT DETECTED
Candida parapsilosis: NOT DETECTED
Candida tropicalis: NOT DETECTED
Candida tropicalis: NOT DETECTED
Carbapenem resistance: NOT DETECTED
Enterobacter cloacae complex: NOT DETECTED
Enterobacter cloacae complex: NOT DETECTED
Enterobacteriaceae species: DETECTED — AB
Enterobacteriaceae species: NOT DETECTED
Enterococcus species: NOT DETECTED
Enterococcus species: NOT DETECTED
Escherichia coli: DETECTED — AB
Escherichia coli: NOT DETECTED
Haemophilus influenzae: NOT DETECTED
Haemophilus influenzae: NOT DETECTED
Klebsiella oxytoca: NOT DETECTED
Klebsiella oxytoca: NOT DETECTED
Klebsiella pneumoniae: NOT DETECTED
Klebsiella pneumoniae: NOT DETECTED
Listeria monocytogenes: NOT DETECTED
Listeria monocytogenes: NOT DETECTED
Neisseria meningitidis: NOT DETECTED
Neisseria meningitidis: NOT DETECTED
Proteus species: NOT DETECTED
Proteus species: NOT DETECTED
Pseudomonas aeruginosa: NOT DETECTED
Pseudomonas aeruginosa: NOT DETECTED
Serratia marcescens: NOT DETECTED
Serratia marcescens: NOT DETECTED
Staphylococcus aureus (BCID): NOT DETECTED
Staphylococcus aureus (BCID): NOT DETECTED
Staphylococcus species: NOT DETECTED
Staphylococcus species: NOT DETECTED
Streptococcus agalactiae: NOT DETECTED
Streptococcus agalactiae: NOT DETECTED
Streptococcus pneumoniae: NOT DETECTED
Streptococcus pneumoniae: NOT DETECTED
Streptococcus pyogenes: NOT DETECTED
Streptococcus pyogenes: NOT DETECTED
Streptococcus species: NOT DETECTED
Streptococcus species: NOT DETECTED

## 2020-05-26 LAB — CBC WITH DIFFERENTIAL/PLATELET
Abs Immature Granulocytes: 0 10*3/uL (ref 0.00–0.07)
Basophils Absolute: 0 10*3/uL (ref 0.0–0.1)
Basophils Relative: 0 %
Eosinophils Absolute: 0.5 10*3/uL (ref 0.0–0.5)
Eosinophils Relative: 2 %
HCT: 28.4 % — ABNORMAL LOW (ref 36.0–46.0)
Hemoglobin: 9.3 g/dL — ABNORMAL LOW (ref 12.0–15.0)
Lymphocytes Relative: 3 %
Lymphs Abs: 0.7 10*3/uL (ref 0.7–4.0)
MCH: 30.5 pg (ref 26.0–34.0)
MCHC: 32.7 g/dL (ref 30.0–36.0)
MCV: 93.1 fL (ref 80.0–100.0)
Monocytes Absolute: 1 10*3/uL (ref 0.1–1.0)
Monocytes Relative: 4 %
Neutro Abs: 21.7 10*3/uL — ABNORMAL HIGH (ref 1.7–7.7)
Neutrophils Relative %: 91 %
Platelets: 453 10*3/uL — ABNORMAL HIGH (ref 150–400)
RBC: 3.05 MIL/uL — ABNORMAL LOW (ref 3.87–5.11)
RDW: 14.5 % (ref 11.5–15.5)
WBC: 23.9 10*3/uL — ABNORMAL HIGH (ref 4.0–10.5)
nRBC: 0 % (ref 0.0–0.2)
nRBC: 0 /100 WBC

## 2020-05-26 LAB — COMPREHENSIVE METABOLIC PANEL
ALT: 16 U/L (ref 0–44)
AST: 24 U/L (ref 15–41)
Albumin: 1.8 g/dL — ABNORMAL LOW (ref 3.5–5.0)
Alkaline Phosphatase: 123 U/L (ref 38–126)
Anion gap: 9 (ref 5–15)
BUN: 6 mg/dL (ref 6–20)
CO2: 23 mmol/L (ref 22–32)
Calcium: 7.3 mg/dL — ABNORMAL LOW (ref 8.9–10.3)
Chloride: 102 mmol/L (ref 98–111)
Creatinine, Ser: 0.9 mg/dL (ref 0.44–1.00)
GFR calc Af Amer: >60 mL/min (ref >60)
GFR calc non Af Amer: >60 mL/min (ref >60)
Glucose, Bld: 86 mg/dL (ref 70–99)
Potassium: 3.7 mmol/L (ref 3.5–5.1)
Sodium: 134 mmol/L — ABNORMAL LOW (ref 135–145)
Total Bilirubin: <0.1 mg/dL — ABNORMAL LOW (ref 0.3–1.2)
Total Protein: 4.9 g/dL — ABNORMAL LOW (ref 6.5–8.1)

## 2020-05-26 LAB — RPR: RPR Ser Ql: NONREACTIVE

## 2020-05-26 LAB — GLUCOSE, CAPILLARY
Glucose-Capillary: 85 mg/dL (ref 70–99)
Glucose-Capillary: 90 mg/dL (ref 70–99)

## 2020-05-26 LAB — URINE CULTURE

## 2020-05-26 MED ORDER — METRONIDAZOLE 500 MG PO TABS
2000.0000 mg | ORAL_TABLET | Freq: Once | ORAL | Status: AC
  Administered 2020-05-26: 2000 mg via ORAL
  Filled 2020-05-26: qty 4

## 2020-05-26 MED ORDER — OXYCODONE HCL 5 MG PO TABS
5.0000 mg | ORAL_TABLET | ORAL | Status: DC | PRN
  Administered 2020-05-26 (×3): 10 mg via ORAL
  Administered 2020-05-27: 5 mg via ORAL
  Administered 2020-05-27 – 2020-05-30 (×17): 10 mg via ORAL
  Filled 2020-05-26 (×21): qty 2

## 2020-05-26 MED ORDER — IBUPROFEN 600 MG PO TABS
600.0000 mg | ORAL_TABLET | Freq: Four times a day (QID) | ORAL | Status: DC | PRN
  Administered 2020-05-26: 600 mg via ORAL
  Filled 2020-05-26: qty 1

## 2020-05-26 MED ORDER — NICOTINE POLACRILEX 2 MG MT GUM
2.0000 mg | CHEWING_GUM | OROMUCOSAL | Status: DC | PRN
  Administered 2020-05-26 (×2): 2 mg via ORAL
  Filled 2020-05-26 (×7): qty 1

## 2020-05-26 MED ORDER — SODIUM CHLORIDE 0.9 % IV BOLUS
500.0000 mL | Freq: Once | INTRAVENOUS | Status: AC
  Administered 2020-05-26: 500 mL via INTRAVENOUS

## 2020-05-26 NOTE — Progress Notes (Signed)
Gynecology Progress Note  Admission Date: 05/25/2020 Current Date: 05/26/2020 10:58 AM  [name] is a 49 y.o. [id number] HD#2 admitted for left TOA and b/l hydrosalpinges   History complicated by: Patient Active Problem List   Diagnosis Date Noted  . TOA (tubo-ovarian abscess) 05/25/2020  . Postmenopausal bleeding 05/25/2020  . Elevated SGOT (AST) 05/25/2020  . Elevated alkaline phosphatase measurement 05/25/2020  . Adenomyosis 07/15/2015  . ETOH abuse 07/14/2015  . Abnormal uterine bleeding (AUB) 07/14/2015    ROS and patient/family/surgical history, located on admission H&P note dated 05/25/2020, have been reviewed, and there are no changes except as noted below Yesterday/Overnight Events:  Pt states she had a BM that was dark, didn't appear to be blood.   Subjective:  Pain stable to slightly improved. Taking PO well  Objective:    Current Vital Signs 24h Vital Sign Ranges  T 98.6 F (37 C) Temp  Avg: 98.6 F (37 C)  Min: 98.2 F (36.8 C)  Max: 99.1 F (37.3 C)  BP 103/85 BP  Min: 97/55  Max: 119/80  HR (!) 102 Pulse  Avg: 97.1  Min: 91  Max: 105  RR 17 Resp  Avg: 18.8  Min: 17  Max: 21  SaO2 99 % Room Air SpO2  Avg: 98.4 %  Min: 96 %  Max: 100 %       24 Hour I/O Current Shift I/O  Time Ins Outs 06/28 0701 - 06/29 0700 In: 932.3 [I.V.:847] Out: -  No intake/output data recorded.    Patient Vitals for the past 24 hrs:  BP Temp Temp src Pulse Resp SpO2  05/26/20 0543 103/85 98.6 F (37 C) Oral (!) 102 17 99 %  05/26/20 0537 -- -- -- -- 18 --  05/26/20 0011 109/73 99.1 F (37.3 C) Oral (!) 105 18 99 %  05/25/20 2109 119/80 98.7 F (37.1 C) Oral 95 18 99 %  05/25/20 2021 -- -- -- -- 18 --  05/25/20 1843 102/73 98.2 F (36.8 C) Oral 91 18 99 %  05/25/20 1548 102/73 98.2 F (36.8 C) Oral 98 -- 100 %  05/25/20 1245 99/68 -- -- 92 (!) 21 98 %  05/25/20 1215 98/85 -- -- 96 20 97 %  05/25/20 1156 -- 98.9 F (37.2 C) Oral -- -- --  05/25/20 1100 (!) 97/55 --  -- 98 (!) 21 96 %    Physical exam: General appearance: alert, cooperative and appears stated age Abdomen: +BS, mild distension, mildly ttp in lower belly, no peritoneal s/s, no masses Lungs: clear to auscultation bilaterally Heart: S1, S2 normal, no murmur, rub or gallop, regular rate and rhythm Extremities: no c/c/e Skin: warm and dry Psych: appropriate Neurologic: Grossly normal  Medications Current Facility-Administered Medications  Medication Dose Route Frequency Provider Last Rate Last Admin  . enoxaparin (LOVENOX) injection 40 mg  40 mg Subcutaneous Q24H [place] Bing, MD   40 mg at 05/25/20 1629  . ondansetron (ZOFRAN) tablet 4 mg  4 mg Oral Q6H PRN [place] Bing, MD       Or  . ondansetron (ZOFRAN) injection 4 mg  4 mg Intravenous Q6H PRN [place] Bing, MD   4 mg at 05/25/20 1838  . oxyCODONE (Oxy IR/ROXICODONE) immediate release tablet 5-10 mg  5-10 mg Oral Q6H PRN [place] Bing, MD   10 mg at 05/26/20 0542  . piperacillin-tazobactam (ZOSYN) IVPB 3.375 g  3.375 g Intravenous Q8H [place] Bing, MD 12.5 mL/hr at 05/26/20 0544 3.375 g at 05/26/20  86  . polyethylene glycol (MIRALAX / GLYCOLAX) packet 17 g  17 g Oral Q breakfast [place] Bing, MD      . prenatal multivitamin tablet 1 tablet  1 tablet Oral Q1200 [place] Bing, MD      . simethicone (MYLICON) chewable tablet 80 mg  80 mg Oral QID PRN [place] Bing, MD          Labs  Recent Labs  Lab 05/25/20 0849 05/26/20 0251  WBC 8.3 23.9*  HGB 12.5 9.3*  HCT 38.5 28.4*  PLT 573* 453*    Recent Labs  Lab 05/25/20 0849 05/26/20 0251  NA 136 134*  K 3.3* 3.7  CL 102 102  CO2 20* 23  BUN 8 6  CREATININE 1.23* 0.90  CALCIUM 8.3* 7.3*  PROT 5.7* 4.9*  BILITOT 0.5 <0.1*  ALKPHOS 259* 123  ALT 23 16  AST 120* 24  GLUCOSE 197* 86    Radiology No new imaging  Assessment & Plan:  Pt improving *GYN: continue zosyn D#2. Likely transition to PO tomorrow *Pain: continue PO  PRNs *FEN/GI:  Regular diet, SLIV. Refer to GI outpatient for dark stools. LFTs normalized *PPx: lovenox, oob ad lib *Dispo: likely tomorrow or thursday  Code Status: Full Code  Total time taking care of the patient was 15 minutes, with greater than 50% of the time spent in face to face interaction with the patient.  Cornelia Copa MD Attending Center for [facility])

## 2020-05-26 NOTE — Progress Notes (Signed)
PHARMACY - PHYSICIAN COMMUNICATION CRITICAL VALUE ALERT - BLOOD CULTURE IDENTIFICATION (BCID)  [name] is an 49 y.o. female who presented to [facility] on 05/25/2020 with a chief complaint of tubal ovarian abscess  Assessment:  3/4 BC with E coli, 1/4 with gram varaible rod  Name of physician (or Provider) Contacted: n/a  Current antibiotics: Zosyn  Changes to prescribed antibiotics recommended:  None.  She would need additional anaerobic coverage if we did change to rocephin so will just continue zosyn  Results for orders placed or performed during the hospital encounter of 05/25/20  Blood Culture ID Panel (Reflexed) (Collected: 05/25/2020  8:49 AM)  Result Value Ref Range   Enterococcus species NOT DETECTED NOT DETECTED   Listeria monocytogenes NOT DETECTED NOT DETECTED   Staphylococcus species NOT DETECTED NOT DETECTED   Staphylococcus aureus (BCID) NOT DETECTED NOT DETECTED   Streptococcus species NOT DETECTED NOT DETECTED   Streptococcus agalactiae NOT DETECTED NOT DETECTED   Streptococcus pneumoniae NOT DETECTED NOT DETECTED   Streptococcus pyogenes NOT DETECTED NOT DETECTED   Acinetobacter baumannii NOT DETECTED NOT DETECTED   Enterobacteriaceae species NOT DETECTED NOT DETECTED   Enterobacter cloacae complex NOT DETECTED NOT DETECTED   Escherichia coli NOT DETECTED NOT DETECTED   Klebsiella oxytoca NOT DETECTED NOT DETECTED   Klebsiella pneumoniae NOT DETECTED NOT DETECTED   Proteus species NOT DETECTED NOT DETECTED   Serratia marcescens NOT DETECTED NOT DETECTED   Haemophilus influenzae NOT DETECTED NOT DETECTED   Neisseria meningitidis NOT DETECTED NOT DETECTED   Pseudomonas aeruginosa NOT DETECTED NOT DETECTED   Candida albicans NOT DETECTED NOT DETECTED   Candida glabrata NOT DETECTED NOT DETECTED   Candida krusei NOT DETECTED NOT DETECTED   Candida parapsilosis NOT DETECTED NOT DETECTED   Candida tropicalis NOT DETECTED NOT DETECTED    Woodfin Ganja 05/26/2020  12:58 AM

## 2020-05-26 NOTE — Significant Event (Signed)
Rapid Response Event Note  Overview:  Called for a Red MEWS. Pt with temp of 102.3 and HR 115.     Initial Focused Assessment: Pt resting in bed with no c/o of pain at this time.   Interventions: Provider called and informed of vitals.  bolus ordered Motrin for temp (RN states Tylenol was d/c'd due to liver)   Plan of Care (if not transferred): Continue to monitor pt vitals. Pt currently on IV Zosyn. Will give meds for temp and pain control as needed. Instructed RN the need for more frequent vitals per MEWS policy. Call with any changes or concerns.  Event Summary:  Called at  [phone]   Event ended at  2307        [facility]

## 2020-05-26 NOTE — Plan of Care (Signed)
  Problem: Education: Goal: Knowledge of General Education information will improve Description: Including pain rating scale, medication(s)/side effects and non-pharmacologic comfort measures 05/26/2020 0108 by Nanine Means, RN Outcome: Progressing 05/26/2020 0106 by Nanine Means, RN Outcome: Progressing

## 2020-05-26 NOTE — Plan of Care (Signed)
  Problem: Education: Goal: Knowledge of General Education information will improve Description Including pain rating scale, medication(s)/side effects and non-pharmacologic comfort measures Outcome: Progressing   

## 2020-05-27 LAB — CBC WITH DIFFERENTIAL/PLATELET
Abs Immature Granulocytes: 0 10*3/uL (ref 0.00–0.07)
Basophils Absolute: 0.2 10*3/uL — ABNORMAL HIGH (ref 0.0–0.1)
Basophils Relative: 1 %
Eosinophils Absolute: 0 10*3/uL (ref 0.0–0.5)
Eosinophils Relative: 0 %
HCT: 29.1 % — ABNORMAL LOW (ref 36.0–46.0)
Hemoglobin: 9.5 g/dL — ABNORMAL LOW (ref 12.0–15.0)
Lymphocytes Relative: 11 %
Lymphs Abs: 2.2 10*3/uL (ref 0.7–4.0)
MCH: 30.2 pg (ref 26.0–34.0)
MCHC: 32.6 g/dL (ref 30.0–36.0)
MCV: 92.4 fL (ref 80.0–100.0)
Monocytes Absolute: 0.6 10*3/uL (ref 0.1–1.0)
Monocytes Relative: 3 %
Neutro Abs: 17.3 10*3/uL — ABNORMAL HIGH (ref 1.7–7.7)
Neutrophils Relative %: 85 %
Platelets: 422 10*3/uL — ABNORMAL HIGH (ref 150–400)
RBC: 3.15 MIL/uL — ABNORMAL LOW (ref 3.87–5.11)
RDW: 14.4 % (ref 11.5–15.5)
WBC: 20.3 10*3/uL — ABNORMAL HIGH (ref 4.0–10.5)
nRBC: 0 % (ref 0.0–0.2)
nRBC: 0 /100 WBC

## 2020-05-27 LAB — CULTURE, BLOOD (ROUTINE X 2)
Special Requests: ADEQUATE
Special Requests: ADEQUATE

## 2020-05-27 MED ORDER — NICOTINE 21 MG/24HR TD PT24
21.0000 mg | MEDICATED_PATCH | Freq: Every day | TRANSDERMAL | Status: DC
  Administered 2020-05-27 – 2020-05-30 (×4): 21 mg via TRANSDERMAL
  Filled 2020-05-27 (×4): qty 1

## 2020-05-27 NOTE — Progress Notes (Signed)
Gynecology Progress Note  Admission Date: 05/25/2020 Current Date: 05/27/2020 10:40 AM  [name] is a 49 y.o. [id number] HD#3 admitted for left TOA and b/l hydrosalpinges   History complicated by: Patient Active Problem List   Diagnosis Date Noted  . Dark stools 05/26/2020  . Pre-diabetes 05/26/2020  . TOA (tubo-ovarian abscess) 05/25/2020  . Postmenopausal bleeding 05/25/2020  . Elevated SGOT (AST) 05/25/2020  . Elevated alkaline phosphatase measurement 05/25/2020  . Adenomyosis 07/15/2015  . ETOH abuse 07/14/2015  . Abnormal uterine bleeding (AUB) 07/14/2015    ROS and patient/family/surgical history, located on admission H&P note dated 05/25/2020, have been reviewed, and there are no changes except as noted below Yesterday/Overnight Events:  Temp in the evening, last temp 100.8 at around midnight  Subjective:  Pain stable to slightly improved. Taking PO well  Objective:    Current Vital Signs 24h Vital Sign Ranges  T 97.9 F (36.6 C) Temp  Avg: 99.3 F (37.4 C)  Min: 97.9 F (36.6 C)  Max: 102.3 F (39.1 C)  BP 98/72 BP  Min: 98/72  Max: 130/84  HR 92 Pulse  Avg: 97.6  Min: 82  Max: 115  RR 16 Resp  Avg: 15.8  Min: 15  Max: 17  SaO2 97 % Room Air SpO2  Avg: 95 %  Min: 91 %  Max: 97 %       24 Hour I/O Current Shift I/O  Time Ins Outs 06/29 0701 - 06/30 0700 In: 1359.8 [P.O.:700] Out: -  No intake/output data recorded.    Patient Vitals for the past 24 hrs:  BP Temp Temp src Pulse Resp SpO2  05/27/20 0651 98/72 97.9 F (36.6 C) Oral 92 16 97 %  05/27/20 0244 98/68 98.2 F (36.8 C) Oral 82 15 97 %  05/27/20 0228 -- -- -- -- 16 --  05/27/20 0145 106/68 98.4 F (36.9 C) Oral 88 15 96 %  05/27/20 0048 101/82 99.2 F (37.3 C) Oral 98 15 91 %  05/26/20 2340 116/70 (!) 100.8 F (38.2 C) Oral (!) 107 16 94 %  05/26/20 2205 130/84 (!) 102.3 F (39.1 C) Oral (!) 115 -- 95 %  05/26/20 2022 -- -- -- -- 17 --  05/26/20 1400 111/78 98.5 F (36.9 C) Oral (!) 101  16 95 %    Physical exam: General appearance: alert, cooperative and appears stated age Abdomen: +BS, nd, nttp,  no masses Lungs: clear to auscultation bilaterally Heart: S1, S2 normal, no murmur, rub or gallop, regular rate and rhythm Extremities: no c/c/e Skin: warm and dry Psych: appropriate Neurologic: Grossly normal  Medications Current Facility-Administered Medications  Medication Dose Route Frequency Provider Last Rate Last Admin  . enoxaparin (LOVENOX) injection 40 mg  40 mg Subcutaneous Q24H [place] Bing, MD   40 mg at 05/26/20 1424  . ibuprofen (ADVIL) tablet 600 mg  600 mg Oral Q6H PRN Constant, Peggy, MD   600 mg at 05/26/20 2318  . nicotine polacrilex (NICORETTE) gum 2 mg  2 mg Oral PRN [place] Bing, MD   2 mg at 05/26/20 1808  . ondansetron (ZOFRAN) tablet 4 mg  4 mg Oral Q6H PRN [place] Bing, MD   4 mg at 05/26/20 1924   Or  . ondansetron (ZOFRAN) injection 4 mg  4 mg Intravenous Q6H PRN [place] Bing, MD   4 mg at 05/25/20 1838  . oxyCODONE (Oxy IR/ROXICODONE) immediate release tablet 5-10 mg  5-10 mg Oral Q4H PRN [place] Bing, MD  10 mg at 05/27/20 0227  . piperacillin-tazobactam (ZOSYN) IVPB 3.375 g  3.375 g Intravenous [place] Liv, MD 12.5 mL/hr at 05/27/20 0609 3.375 g at 05/27/20 0609  . polyethylene glycol (MIRALAX / GLYCOLAX) packet 17 g  17 g Oral Q breakfast [place] Bing, MD   17 g at 05/27/20 1025  . prenatal multivitamin tablet 1 tablet  1 tablet Oral Q1200 [place] Bing, MD   1 tablet at 05/26/20 1111  . simethicone (MYLICON) chewable tablet 80 mg  80 mg Oral QID PRN [place] Bing, MD          Labs  Recent Labs  Lab 05/25/20 [phone] 05/26/20 0251 05/27/20 0312  WBC 8.3 23.9* 20.3*  HGB 12.5 9.3* 9.5*  HCT 38.5 28.4* 29.1*  PLT 573* 453* 422*    Recent Labs  Lab 05/25/20 0849 05/26/20 0251  NA 136 134*  K 3.3* 3.7  CL 102 102  CO2 20* 23  BUN 8 6  CREATININE 1.23* 0.90  CALCIUM 8.3* 7.3*  PROT 5.7*  4.9*  BILITOT 0.5 <0.1*  ALKPHOS 259* 123  ALT 23 16  AST 120* 24  GLUCOSE 197* 86    Radiology No new imaging  Assessment & Plan:  Pt improving *GYN: continue zosyn D#3, s/p flagyl 2gm. Hopefully transition to PO tomorrow *Pain: continue PO PRNs *FEN/GI:  Regular diet, SLIV. Refer to GI outpatient for dark stools. LFTs normalized *PPx: lovenox, oob ad lib *Dispo: likely  thursday  Code Status: Full Code  Total time taking care of the patient was 15 minutes, with greater than 50% of the time spent in face to face interaction with the patient.  Cornelia Copa MD Attending Center for [facility])

## 2020-05-27 NOTE — Plan of Care (Signed)
  Problem: Education: Goal: Knowledge of General Education information will improve Description Including pain rating scale, medication(s)/side effects and non-pharmacologic comfort measures Outcome: Progressing   

## 2020-05-27 NOTE — Progress Notes (Addendum)
05/26/20 2205  Assess: MEWS Score  Temp (!) 102.3 F (39.1 C)  BP 130/84  Pulse Rate (!) 115  Level of Consciousness Alert  SpO2 95 %  O2 Device Room Air  Assess: MEWS Score  MEWS Temp 2  MEWS Systolic 0  MEWS Pulse 2  MEWS RR 0  MEWS LOC 0  MEWS Score 4  MEWS Score Color Red  Assess: if the MEWS score is Yellow or Red  Were vital signs taken at a resting state? Yes  Focused Assessment Documented focused assessment  Early Detection of Sepsis Score *See Row Information* High  MEWS guidelines implemented *See Row Information* Yes  Treat  MEWS Interventions Escalated (See documentation below)  Take Vital Signs  Increase Vital Sign Frequency  Red: Q 1hr X 4 then Q 4hr X 4, if remains red, continue Q 4hrs  Escalate  MEWS: Escalate Red: discuss with charge nurse/RN and provider, consider discussing with RRT  Notify: Charge Nurse/RN  Name of Charge Nurse/RN Notified Leota Jacobsen, Charge RN  Date Charge Nurse/RN Notified 05/26/20  Time Charge Nurse/RN Notified 2244  Notify: Provider  Provider Name/Title Dr. Jolayne Panther  Date Provider Notified 05/26/20  Time Provider Notified 2245  Notification Type Call  Notification Reason Change in status;Other (Comment) (RED MEWS)  Response See new orders  Date of Provider Response 05/26/20  Time of Provider Response 2255  Notify: Rapid Response  Name of Rapid Response RN Notified  Lowella Bandy, Rapid Response RN)  Date Rapid Response Notified 05/26/20  Time Rapid Response Notified 2247  Document  Patient Outcome Stabilized after interventions  Progress note created (see row info) Yes     05/26/20 2205  Assess: MEWS Score  Temp (!) 102.3 F (39.1 C)  BP 130/84  Pulse Rate (!) 115  Level of Consciousness Alert  SpO2 95 %  O2 Device Room Air  Assess: MEWS Score  MEWS Temp 2  MEWS Systolic 0  MEWS Pulse 2  MEWS RR 0  MEWS LOC 0  MEWS Score 4  MEWS Score Color Red  Assess: if the MEWS score is Yellow or Red  Were vital signs taken  at a resting state? Yes  Focused Assessment Documented focused assessment  Early Detection of Sepsis Score *See Row Information* High  MEWS guidelines implemented *See Row Information* Yes  Treat  MEWS Interventions Escalated (See documentation below)  Take Vital Signs  Increase Vital Sign Frequency  Red: Q 1hr X 4 then Q 4hr X 4, if remains red, continue Q 4hrs  Escalate  MEWS: Escalate Red: discuss with charge nurse/RN and provider, consider discussing with RRT  Notify: Charge Nurse/RN  Name of Charge Nurse/RN Notified Leota Jacobsen, Charge RN  Date Charge Nurse/RN Notified 05/26/20  Time Charge Nurse/RN Notified 2244  Notify: Provider  Provider Name/Title Dr. Jolayne Panther  Date Provider Notified 05/26/20  Time Provider Notified 2245  Notification Type Call  Notification Reason Change in status;Other (Comment) (RED MEWS)  Response See new orders  Date of Provider Response 05/26/20  Time of Provider Response 2255  Notify: Rapid Response  Name of Rapid Response RN Notified  Lowella Bandy, Rapid Response RN)  Date Rapid Response Notified 05/26/20  Time Rapid Response Notified 2247  Document  Patient Outcome Stabilized after interventions  Progress note created (see row info) Yes  Pt experienced a MEWS change from GREEN to RED due to an increased temperature and heart rate. She denied pain or distress and stated that the only difference that she felt  was that her right flank felt "tight". Her abdomen is mildly distended and taut on right side but only minimally moreso that previous assessment. Charge RN [employer] notified of MEWS change and RED MEWS protocol started. Charge RN Delice Bison also notified unit manager Shanon Brow of MEWS change via text. Rapid Response RN Lowella Bandy called to inform and assess. She gave orders to place ice packs on patient as there were no current orders for antipyretic medications. RR Nurse Lowella Bandy then came to unit to assess patient. Dr. Catalina Antigua called to inform of  patients status & MEWS change. Orders for one time 500 ml NS bolus and Motrin 600 mg every 6 hours PO, PRN were obtained and carried out as ordered. Pt changed to YELLOW MEWS by next round of vital signs and GREEN MEWS by the third set. Pt is resting comfortably with no s/s of distress or complaints/concerns voiced. 05/27/2020 @ 0207 Manson Allan, RN

## 2020-05-28 ENCOUNTER — Other Ambulatory Visit: Payer: Self-pay

## 2020-05-28 ENCOUNTER — Encounter (HOSPITAL_COMMUNITY): Payer: Self-pay | Admitting: Obstetrics and Gynecology

## 2020-05-28 ENCOUNTER — Inpatient Hospital Stay (HOSPITAL_COMMUNITY): Payer: Self-pay

## 2020-05-28 DIAGNOSIS — N7093 Salpingitis and oophoritis, unspecified: Secondary | ICD-10-CM

## 2020-05-28 HISTORY — DX: Salpingitis and oophoritis, unspecified: N70.93

## 2020-05-28 LAB — CBC WITH DIFFERENTIAL/PLATELET
Abs Immature Granulocytes: 0.1 10*3/uL — ABNORMAL HIGH (ref 0.00–0.07)
Basophils Absolute: 0 10*3/uL (ref 0.0–0.1)
Basophils Relative: 0 %
Eosinophils Absolute: 0.3 10*3/uL (ref 0.0–0.5)
Eosinophils Relative: 2 %
HCT: 30.8 % — ABNORMAL LOW (ref 36.0–46.0)
Hemoglobin: 10 g/dL — ABNORMAL LOW (ref 12.0–15.0)
Immature Granulocytes: 1 %
Lymphocytes Relative: 8 %
Lymphs Abs: 1.4 10*3/uL (ref 0.7–4.0)
MCH: 29.9 pg (ref 26.0–34.0)
MCHC: 32.5 g/dL (ref 30.0–36.0)
MCV: 92.2 fL (ref 80.0–100.0)
Monocytes Absolute: 0.6 10*3/uL (ref 0.1–1.0)
Monocytes Relative: 4 %
Neutro Abs: 13.9 10*3/uL — ABNORMAL HIGH (ref 1.7–7.7)
Neutrophils Relative %: 85 %
Platelets: 426 10*3/uL — ABNORMAL HIGH (ref 150–400)
RBC: 3.34 MIL/uL — ABNORMAL LOW (ref 3.87–5.11)
RDW: 14.4 % (ref 11.5–15.5)
WBC: 16.3 10*3/uL — ABNORMAL HIGH (ref 4.0–10.5)
nRBC: 0 % (ref 0.0–0.2)

## 2020-05-28 MED ORDER — IOHEXOL 300 MG/ML  SOLN
100.0000 mL | Freq: Once | INTRAMUSCULAR | Status: AC | PRN
  Administered 2020-05-28: 100 mL via INTRAVENOUS

## 2020-05-28 MED ORDER — SODIUM CHLORIDE 0.9 % IV SOLN
3.0000 g | Freq: Three times a day (TID) | INTRAVENOUS | Status: DC
  Administered 2020-05-28 – 2020-05-29 (×4): 3 g via INTRAVENOUS
  Filled 2020-05-28: qty 3
  Filled 2020-05-28 (×2): qty 8
  Filled 2020-05-28: qty 0.15
  Filled 2020-05-28 (×2): qty 8

## 2020-05-28 MED ORDER — LACTATED RINGERS IV SOLN: INTRAVENOUS | Status: DC

## 2020-05-28 NOTE — Progress Notes (Signed)
Pharmacy Antibiotic Note  [name] is a 49 y.o. female admitted on 05/25/2020 with left TOA and b/l hydrosalpinges .  Pharmacy has been consulted for Unasyn dosing.  ID: TOA pt received zosyn in ED and tolerated well, PCN allergy was apparently from childhood. - Tmax 100.3. WBC 16.3 down. Scr WNL  - 6/28 Zosyn>>7/1 - 7/1: Unasyn  6/28: BC x 2: Ecoli pan sensitive  Plan: Unasyn 3g IV q8hr     Height: 5\' 1"  (154.9 cm) Weight: 56.7 kg (125 lb) IBW/kg (Calculated) : 47.8  Temp (24hrs), Avg:98.8 F (37.1 C), Min:98.2 F (36.8 C), Max:100.3 F (37.9 C)  Recent Labs  Lab 05/25/20 0849 05/25/20 1133 05/25/20 1622 05/26/20 0251 05/27/20 0312 05/28/20 0252  WBC 8.3  --   --  23.9* 20.3* 16.3*  CREATININE 1.23*  --   --  0.90  --   --   LATICACIDVEN 6.2* 2.0* 1.7  --   --   --     Estimated Creatinine Clearance: 57.1 mL/min (by C-G formula based on SCr of 0.9 mg/dL).    Allergies  Allergen Reactions  . Penicillins Other (See Comments)    Childhood reaction. Tolerated Zosyn  . Morphine And Related Itching and Rash     Jesselee Poth S. 07/29/20, PharmD, BCPS Clinical Staff Pharmacist [email or web address] Merilynn Finland 05/28/2020 10:51 AM

## 2020-05-28 NOTE — Progress Notes (Addendum)
Gynecology Progress Note  Admission Date: 05/25/2020 Current Date: 05/28/2020 9:44 AM  [name] is a 49 y.o. [id number] HD#4 admitted for left TOA and b/l hydrosalpinges   History complicated by: Patient Active Problem List   Diagnosis Date Noted  . Dark stools 05/26/2020  . Pre-diabetes 05/26/2020  . TOA (tubo-ovarian abscess) 05/25/2020  . Postmenopausal bleeding 05/25/2020  . Elevated SGOT (AST) 05/25/2020  . Elevated alkaline phosphatase measurement 05/25/2020  . Adenomyosis 07/15/2015  . ETOH abuse 07/14/2015  . Abnormal uterine bleeding (AUB) 07/14/2015    ROS and patient/family/surgical history, located on admission H&P note dated 05/25/2020, have been reviewed, and there are no changes except as noted below Yesterday/Overnight Events:  Had a temp spike this mornig  Subjective:  Having some right sided low belly/mid back pain, some nausea w/o vomiting but taking PO fine  Objective:    Current Vital Signs 24h Vital Sign Ranges  T 100.3 F (37.9 C) Temp  Avg: 98.8 F (37.1 C)  Min: 98.2 F (36.8 C)  Max: 100.3 F (37.9 C)  BP (!) 126/91 BP  Min: 113/78  Max: 133/91  HR 97 Pulse  Avg: 94.8  Min: 88  Max: 98  RR 16 Resp  Avg: 17.3  Min: 16  Max: 18  SaO2 97 % Room Air SpO2  Avg: 98.5 %  Min: 97 %  Max: 100 %       24 Hour I/O Current Shift I/O  Time Ins Outs 06/30 0701 - 07/01 0700 In: 600 [P.O.:600] Out: -  No intake/output data recorded.    Patient Vitals for the past 24 hrs:  BP Temp Temp src Pulse Resp SpO2  05/28/20 0450 (!) 126/91 100.3 F (37.9 C) Oral 97 -- 97 %  05/27/20 1957 113/78 98.5 F (36.9 C) Oral 98 16 98 %  05/27/20 1455 117/78 98.3 F (36.8 C) Oral 88 18 100 %  05/27/20 1103 (!) 133/91 98.2 F (36.8 C) Oral 96 18 99 %    Physical exam: General appearance: alert, cooperative and appears stated age Abdomen: +BS, mildly distended, nttp,  no masses Lungs: clear to auscultation bilaterally Heart: S1, S2 normal, no murmur, rub or  gallop, regular rate and rhythm Extremities: no c/c/e Skin: warm to slightly hot and dry Psych: appropriate Neurologic: Grossly normal  Medications Current Facility-Administered Medications  Medication Dose Route Frequency Provider Last Rate Last Admin  . enoxaparin (LOVENOX) injection 40 mg  40 mg Subcutaneous Q24H [place] Bing, MD   40 mg at 05/27/20 1307  . ibuprofen (ADVIL) tablet 600 mg  600 mg Oral Q6H PRN Constant, Peggy, MD   600 mg at 05/26/20 2318  . nicotine (NICODERM CQ - dosed in mg/24 hours) patch 21 mg  21 mg Transdermal Daily [place] Bing, MD   21 mg at 05/27/20 1119  . ondansetron (ZOFRAN) tablet 4 mg  4 mg Oral Q6H PRN [place] Bing, MD   4 mg at 05/26/20 1924   Or  . ondansetron (ZOFRAN) injection 4 mg  4 mg Intravenous Q6H PRN [place] Bing, MD   4 mg at 05/25/20 1838  . oxyCODONE (Oxy IR/ROXICODONE) immediate release tablet 5-10 mg  5-10 mg Oral Q4H PRN [place] Bing, MD   10 mg at 05/28/20 0938  . piperacillin-tazobactam (ZOSYN) IVPB 3.375 g  3.375 g Intravenous Leonor Liv, MD 12.5 mL/hr at 05/28/20 0651 3.375 g at 05/28/20 0651  . polyethylene glycol (MIRALAX / GLYCOLAX) packet 17 g  17 g Oral Q breakfast  [place] Bing, MD   17 g at 05/27/20 1025  . prenatal multivitamin tablet 1 tablet  1 tablet Oral Q1200 [place] Bing, MD   1 tablet at 05/27/20 1307  . simethicone (MYLICON) chewable tablet 80 mg  80 mg Oral QID PRN [place] Bing, MD          Labs  BCx + for [facility]  Recent Labs  Lab 05/26/20 0251 05/27/20 0312 05/28/20 0252  WBC 23.9* 20.3* 16.3*  HGB 9.3* 9.5* 10.0*  HCT 28.4* 29.1* 30.8*  PLT 453* 422* 426*    Recent Labs  Lab 05/25/20 0849 05/26/20 0251  NA 136 134*  K 3.3* 3.7  CL 102 102  CO2 20* 23  BUN 8 6  CREATININE 1.23* 0.90  CALCIUM 8.3* 7.3*  PROT 5.7* 4.9*  BILITOT 0.5 <0.1*  ALKPHOS 259* 123  ALT 23 16  AST 120* 24  GLUCOSE 197* 86    Radiology No new imaging  Assessment & Plan:   Pt improving *GYN: continue zosyn D#4, s/p flagyl 2gm. I d/w IR prior to rounds and they said, based on her admit images, IR drainage will be difficult and would recommend rpt scan and eval if strongly consider IR eval. Pt feels warm on skin so likely with still a low grade temp and belly exam is different today. Will get ct a/p with contrast and d/w IR again. I'll also d/w ID given +BCx *Pain: continue PO PRNs *FEN/GI:  Regular diet, SLIV.  -Refer to GI outpatient for dark stools *PPx: will hold her early afternoon lovenox and place SCDs, oob ad lib *Dispo: hopefully this weekend  Code Status: Full Code  Total time taking care of the patient was 30 minutes, with greater than 50% of the time spent in face to face interaction with the patient.  Cornelia Copa MD Attending Center for [facility])

## 2020-05-28 NOTE — Progress Notes (Signed)
GYN Note Will d/c zosyn and switch to unasyn and if then can transition to PO augmentin as patient improves per d/w ID Dr. Orvan Falconer.

## 2020-05-29 ENCOUNTER — Inpatient Hospital Stay (HOSPITAL_COMMUNITY): Payer: Self-pay

## 2020-05-29 LAB — BASIC METABOLIC PANEL
Anion gap: 10 (ref 5–15)
BUN: <5 mg/dL — ABNORMAL LOW (ref 6–20)
CO2: 27 mmol/L (ref 22–32)
Calcium: 8.1 mg/dL — ABNORMAL LOW (ref 8.9–10.3)
Chloride: 103 mmol/L (ref 98–111)
Creatinine, Ser: 0.8 mg/dL (ref 0.44–1.00)
GFR calc Af Amer: >60 mL/min (ref >60)
GFR calc non Af Amer: >60 mL/min (ref >60)
Glucose, Bld: 100 mg/dL — ABNORMAL HIGH (ref 70–99)
Potassium: 3.2 mmol/L — ABNORMAL LOW (ref 3.5–5.1)
Sodium: 140 mmol/L (ref 135–145)

## 2020-05-29 LAB — CBC WITH DIFFERENTIAL/PLATELET
Abs Immature Granulocytes: 0.07 10*3/uL (ref 0.00–0.07)
Basophils Absolute: 0 10*3/uL (ref 0.0–0.1)
Basophils Relative: 0 %
Eosinophils Absolute: 0.2 10*3/uL (ref 0.0–0.5)
Eosinophils Relative: 1 %
HCT: 34.3 % — ABNORMAL LOW (ref 36.0–46.0)
Hemoglobin: 11.6 g/dL — ABNORMAL LOW (ref 12.0–15.0)
Immature Granulocytes: 1 %
Lymphocytes Relative: 13 %
Lymphs Abs: 1.5 10*3/uL (ref 0.7–4.0)
MCH: 31.1 pg (ref 26.0–34.0)
MCHC: 33.8 g/dL (ref 30.0–36.0)
MCV: 92 fL (ref 80.0–100.0)
Monocytes Absolute: 0.7 10*3/uL (ref 0.1–1.0)
Monocytes Relative: 6 %
Neutro Abs: 8.9 10*3/uL — ABNORMAL HIGH (ref 1.7–7.7)
Neutrophils Relative %: 79 %
Platelets: 496 10*3/uL — ABNORMAL HIGH (ref 150–400)
RBC: 3.73 MIL/uL — ABNORMAL LOW (ref 3.87–5.11)
RDW: 14.5 % (ref 11.5–15.5)
WBC: 11.4 10*3/uL — ABNORMAL HIGH (ref 4.0–10.5)
nRBC: 0 % (ref 0.0–0.2)

## 2020-05-29 MED ORDER — POTASSIUM CHLORIDE CRYS ER 20 MEQ PO TBCR
20.0000 meq | EXTENDED_RELEASE_TABLET | Freq: Two times a day (BID) | ORAL | Status: DC
  Administered 2020-05-29 – 2020-05-30 (×3): 20 meq via ORAL
  Filled 2020-05-29 (×3): qty 1

## 2020-05-29 MED ORDER — FENTANYL CITRATE (PF) 100 MCG/2ML IJ SOLN
INTRAMUSCULAR | Status: AC | PRN
  Administered 2020-05-29: 50 ug via INTRAVENOUS

## 2020-05-29 MED ORDER — SIMETHICONE 80 MG PO CHEW: 80.0000 mg | CHEWABLE_TABLET | Freq: Four times a day (QID) | ORAL | 0 refills | Status: DC | PRN

## 2020-05-29 MED ORDER — ONDANSETRON 4 MG PO TBDP
4.0000 mg | ORAL_TABLET | Freq: Four times a day (QID) | ORAL | 0 refills | Status: DC | PRN
Start: 2020-05-29 — End: 2020-08-12

## 2020-05-29 MED ORDER — OXYCODONE HCL 5 MG PO TABS: 5.0000 mg | ORAL_TABLET | ORAL | 0 refills | Status: DC | PRN

## 2020-05-29 MED ORDER — AMOXICILLIN-POT CLAVULANATE 875-125 MG PO TABS
1.0000 | ORAL_TABLET | Freq: Two times a day (BID) | ORAL | 0 refills | Status: AC
Start: 2020-05-29 — End: 2020-06-12

## 2020-05-29 MED ORDER — MIDAZOLAM HCL 2 MG/2ML IJ SOLN
INTRAMUSCULAR | Status: AC
  Filled 2020-05-29: qty 2

## 2020-05-29 MED ORDER — SODIUM CHLORIDE 0.9% FLUSH
5.0000 mL | Freq: Three times a day (TID) | INTRAVENOUS | Status: DC
  Administered 2020-05-29: 5 mL

## 2020-05-29 MED ORDER — ENOXAPARIN SODIUM 40 MG/0.4ML ~~LOC~~ SOLN
40.0000 mg | SUBCUTANEOUS | Status: DC
  Administered 2020-05-29: 40 mg via SUBCUTANEOUS
  Filled 2020-05-29: qty 0.4

## 2020-05-29 MED ORDER — AMOXICILLIN-POT CLAVULANATE 875-125 MG PO TABS
1.0000 | ORAL_TABLET | Freq: Two times a day (BID) | ORAL | Status: DC
  Administered 2020-05-29 – 2020-05-30 (×3): 1 via ORAL
  Filled 2020-05-29 (×3): qty 1

## 2020-05-29 MED ORDER — MIDAZOLAM HCL 2 MG/2ML IJ SOLN
INTRAMUSCULAR | Status: AC | PRN
  Administered 2020-05-29: 1 mg via INTRAVENOUS

## 2020-05-29 MED ORDER — FENTANYL CITRATE (PF) 100 MCG/2ML IJ SOLN
INTRAMUSCULAR | Status: AC
  Filled 2020-05-29: qty 2

## 2020-05-29 MED ORDER — POLYETHYLENE GLYCOL 3350 17 G PO PACK: 17.0000 g | PACK | Freq: Every day | ORAL | 0 refills | Status: DC | PRN

## 2020-05-29 MED FILL — POLYETHYLENE GLYCOL 3350 PO: 17 | 30 days supply | Qty: 510 | Fill #0

## 2020-05-29 MED FILL — MI-ACID GAS 80 MG TAB CHEW: 80 | 7 days supply | Qty: 30 | Fill #0

## 2020-05-29 MED FILL — ONDANSETRON ODT 4 MG TABLET: 4 | 7 days supply | Qty: 30 | Fill #0

## 2020-05-29 MED FILL — oxyCODONE HCL 5 MG TABS: 5 | 3 days supply | Qty: 30 | Fill #0

## 2020-05-29 MED FILL — AMOX-CLAV 875-125 MG TABLET: 875-125 | 14 days supply | Qty: 28 | Fill #0

## 2020-05-29 NOTE — Progress Notes (Signed)
ANTICOAGULATION CONSULT NOTE   Pharmacy Consult for Restarting Anticoagulants/Antiplatelet Drugs Post-IR Procedure Indication: VTE prophylaxis  Allergies  Allergen Reactions  . Penicillins Other (See Comments)    Childhood reaction. Tolerated Zosyn and Unasyn.   Marland Kitchen Morphine And Related Itching and Rash    Patient Measurements: Height: 5\' 1"  (154.9 cm) Weight: 56.7 kg (125 lb) IBW/kg (Calculated) : 47.8  Vital Signs: Temp: 98.6 F (37 C) (07/02 1411) Temp Source: Oral (07/02 1411) BP: 158/88 (07/02 1411) Pulse Rate: 92 (07/02 1411)  Labs: Recent Labs    05/27/20 0312 05/27/20 0312 05/28/20 0252 05/29/20 0255  HGB 9.5*   < > 10.0* 11.6*  HCT 29.1*  --  30.8* 34.3*  PLT 422*  --  426* 496*  CREATININE  --   --   --  0.80   < > = values in this interval not displayed.    Estimated Creatinine Clearance: 64.2 mL/min (by C-G formula based on SCr of 0.8 mg/dL).   Medical History: Past Medical History:  Diagnosis Date  . Abnormal Pap smear of cervix   . Anemia   . Anxiety   . Bacterial vaginosis   . Depression   . GERD (gastroesophageal reflux disease)   . Headache   . TOA (tubo-ovarian abscess) 05/28/2020    Assessment: 49 yr old female admitted on 05/25/20 with left TOA. Pt is S/P image-guided drain placement, left TOA, by IR today (low bleeding risk procedure, per consult). Pharmacy is consulted to resume anticoagulants/antiplatelet medications at the appropriate time post-IR procedure.  Pt was receiving enoxaparin 40 mg SQ daily for VTE prophylaxis prior to the procedure (last dose was given at 1307 on 05/27/20).  H/H 11.6/34.3, platelets 496 (CBC stable); TBW CrCl ~76 ml/min. Per RN, no bleeding issues noted post-IR procedure.  Goal of Therapy:  Prevention of VTE Monitor platelets by anticoagulation protocol: Yes   Plan:  Resume enoxaparin 40 mg SQ daily this evening Monitor CBC Monitor for signs/symptoms of bleeding  06-04-1998, PharmD, BCPS,  [facility] Clinical Pharmacist 05/29/2020,7:20 PM

## 2020-05-29 NOTE — Discharge Instructions (Signed)
Pelvic Inflammatory Disease  Pelvic inflammatory disease (PID) is an infection in some or all of the female reproductive organs. PID can be in the womb (uterus), ovaries, fallopian tubes, or nearby tissues that are inside the lower belly area (pelvis). PID can lead to problems if it is not treated. What are the causes?  Germs (bacteria) that are spread during sex. This is the most common cause.  Germs in the vagina that are not spread during sex.  Germs that travel up from the vagina or cervix to the reproductive organs after: ? The birth of a baby. ? A miscarriage. ? An abortion. ? Pelvic surgery. ? Insertion of an intrauterine device (IUD). ? A sexual assault. What increases the risk?  Being younger than 50 years old.  Having sex at a young age.  Having a history of STI (sexually transmitted infection) or PID.  Not using barrier birth control, such as condoms.  Having a lot of sex partners.  Having sex with someone who has symptoms of an STI.  Using a douche.  Having an IUD put in place. What are the signs or symptoms?  Pain in the belly area.  Fever.  Chills.  Discharge from the vagina that is not normal.  Bleeding from the womb that is not normal.  Pain soon after the end of a menstrual period.  Pain when you pee (urinate).  Pain with sex.  Feeling sick to your stomach (nauseous) or throwing up (vomiting). How is this treated?  Antibiotic medicines. In very bad cases, these may be given through an IV tube.  Surgery. This is rare.  Efforts to stop the spread of the infection. Sex partners may need to be treated. It may take weeks until you feel all better. Your doctor may test you for infection again after you finish treatment. You should also be checked for HIV (human immunodeficiency virus). Follow these instructions at home:  Take over-the-counter and prescription medicines only as told by your doctor.  If you were prescribed an antibiotic  medicine, take it as told by your doctor. Do not stop taking it even if you start to feel better.  Do not have sex until treatment is done or as told by your doctor.  Tell your sex partner if you have PID. Your partner may need to be treated.  Keep all follow-up visits as told by your doctor. This is important. Contact a doctor if:  You have more fluid or fluid that is not normal coming from your vagina.  Your pain does not improve.  You throw up.  You have a fever.  You cannot take your medicines.  Your partner has an STI.  You have pain when you pee. Get help right away if:  You have more pain in the belly area.  You have chills.  You are not better in 72 hours with treatment. Summary  Pelvic inflammatory disease (PID) is caused by an infection in some or all of the female reproductive organs.  PID is a serious infection.  This infection is most often treated with antibiotics.  Do not have sex until treatment is done or as told by your doctor. This information is not intended to replace advice given to you by your health care provider. Make sure you discuss any questions you have with your health care provider. Document Revised: 08/02/2018 Document Reviewed: 08/08/2018 Elsevier Patient Education  2020 Elsevier Inc.  

## 2020-05-29 NOTE — Progress Notes (Signed)
Gynecology Progress Note  Admission Date: 05/25/2020 Current Date: 05/29/2020 8:25 AM  [name] is a 49 y.o. [id number] HD#5 admitted for left TOA and b/l hydrosalpinges   History complicated by: Patient Active Problem List   Diagnosis Date Noted  . Dark stools 05/26/2020  . Pre-diabetes 05/26/2020  . TOA (tubo-ovarian abscess) 05/25/2020  . Postmenopausal bleeding 05/25/2020  . Elevated SGOT (AST) 05/25/2020  . Elevated alkaline phosphatase measurement 05/25/2020  . Adenomyosis 07/15/2015  . ETOH abuse 07/14/2015  . Abnormal uterine bleeding (AUB) 07/14/2015    ROS and patient/family/surgical history, located on admission H&P note dated 05/25/2020, have been reviewed, and there are no changes except as noted below Yesterday/Overnight Events:  Had CT last night  Subjective:  Stable right sided discomfort.   Objective:    Current Vital Signs 24h Vital Sign Ranges  T 98.2 F (36.8 C) Temp  Avg: 98.4 F (36.9 C)  Min: 98.1 F (36.7 C)  Max: 98.9 F (37.2 C)  BP 135/87 BP  Min: 135/87  Max: 139/88  HR (!) 104 Pulse  Avg: 97.7  Min: 90  Max: 104  RR 18 Resp  Avg: 16.3  Min: 15  Max: 18  SaO2 98 % Room Air SpO2  Avg: 99 %  Min: 98 %  Max: 100 %       24 Hour I/O Current Shift I/O  Time Ins Outs 07/01 0701 - 07/02 0700 In: 1355.8 [P.O.:570; I.V.:460.6] Out: -  No intake/output data recorded.    Patient Vitals for the past 24 hrs:  BP Temp Temp src Pulse Resp SpO2  05/29/20 0451 135/87 98.2 F (36.8 C) Oral (!) 104 18 98 %  05/28/20 2000 135/81 98.1 F (36.7 C) Oral 99 16 100 %  05/28/20 1315 139/88 98.9 F (37.2 C) Oral 90 15 99 %    Physical exam: General appearance: alert, cooperative and appears stated age Abdomen: +BS, mildly distended, nttp,  no masses Lungs: clear to auscultation bilaterally Heart: S1, S2 normal, no murmur, rub or gallop, regular rate and rhythm Extremities: no c/c/e Skin: warm to slightly hot and dry Psych: appropriate Neurologic:  Grossly normal  Medications Current Facility-Administered Medications  Medication Dose Route Frequency Provider Last Rate Last Admin  . Ampicillin-Sulbactam (UNASYN) 3 g in sodium chloride 0.9 % 100 mL IVPB  3 g Intravenous Q8H Robertson, Crystal S, RPH 200 mL/hr at 05/29/20 0715 3 g at 05/29/20 0715  . ibuprofen (ADVIL) tablet 600 mg  600 mg Oral Q6H PRN Constant, Peggy, MD   600 mg at 05/26/20 2318  . lactated ringers infusion   Intravenous Continuous [place] Bing, MD 100 mL/hr at 05/29/20 0524 New Bag at 05/29/20 0524  . nicotine (NICODERM CQ - dosed in mg/24 hours) patch 21 mg  21 mg Transdermal Daily [place] Bing, MD   21 mg at 05/28/20 1015  . ondansetron (ZOFRAN) tablet 4 mg  4 mg Oral Q6H PRN [place] Bing, MD   4 mg at 05/28/20 1835   Or  . ondansetron (ZOFRAN) injection 4 mg  4 mg Intravenous Q6H PRN [place] Bing, MD   4 mg at 05/25/20 1838  . oxyCODONE (Oxy IR/ROXICODONE) immediate release tablet 5-10 mg  5-10 mg Oral Q4H PRN [place] Bing, MD   10 mg at 05/29/20 0523  . polyethylene glycol (MIRALAX / GLYCOLAX) packet 17 g  17 g Oral Q breakfast [place] Bing, MD   17 g at 05/28/20 1014  . prenatal multivitamin tablet 1 tablet  1 tablet Oral Q1200 [place] Bing, MD   1 tablet at 05/28/20 1209  . simethicone (MYLICON) chewable tablet 80 mg  80 mg Oral QID PRN [place] Bing, MD          Labs  BCx + for [facility]  Recent Labs  Lab 05/27/20 0312 05/28/20 0252 05/29/20 0255  WBC 20.3* 16.3* 11.4*  HGB 9.5* 10.0* 11.6*  HCT 29.1* 30.8* 34.3*  PLT 422* 426* 496*    Recent Labs  Lab 05/25/20 0849 05/26/20 0251 05/29/20 0255  NA 136 134* 140  K 3.3* 3.7 3.2*  CL 102 102 103  CO2 20* 23 27  BUN 8 6 <5*  CREATININE 1.23* 0.90 0.80  CALCIUM 8.3* 7.3* 8.1*  PROT 5.7* 4.9*  --   BILITOT 0.5 <0.1*  --   ALKPHOS 259* 123  --   ALT 23 16  --   AST 120* 24  --   GLUCOSE 197* 86 100*    Radiology No new imaging  Assessment & Plan:  Pt  improving *GYN: continue unasyn D#2. D/w IR and given size not really decreasing that it's worth trying to drain.  -s/p zosyn D#4, s/p flagyl 2gm.  *Pain: continue PO PRNs *FEN/GI:  NPO, MIVF -Refer to GI outpatient for dark stools *PPx: SCDs *Dispo: hopefully this weekend  Code Status: Full Code  Total time taking care of the patient was 30 minutes, with greater than 50% of the time spent in face to face interaction with the patient.  Cornelia Copa MD Attending Center for [facility] (Faculty Practice) GYN Consult Phone: [phone] (M-F, 0800-1700) & [phone] (Off hours, weekends, holidays)

## 2020-05-29 NOTE — H&P (Signed)
Chief Complaint: Patient was seen in consultation today for  Chief Complaint  Patient presents with  . Abdominal Pain  . Fever    Referring Physician(s): Dr. Manderson Bingharlie Pickens, Obstetrics/GYN  Supervising Physician: Gilmer MorWagner, Jaime  Patient Status: [facility] - In-pt  History of Present Illness: [name] is a 49 y.o. female with a past medical history that includes anemia, anxiety, depression and tubo-ovarian abscesses. She presented to the ED 05/25/20 with fever, N/V/D, malaise, abdominal and back pain. She was found to be tachycardic and febrile with a lactic acid of 6.2.  CT abdomen pelvis w/contrast 05/25/20: 1. Left tubal ovarian abscess. Right pyosalpinx. 2. Hepatomegaly. 3. Aortic atherosclerosis    CT abdomen pelvis w/contrast 05/28/20: Reproductive: Tubular fluid-filled structures are again seen within the bilateral adnexae, left greater than right. On the right, fluid-filled fallopian tube measures up to 0.6 cm in diameter, previously having measured 1.2 cm. On the left, the large tubo-ovarian abscess seen previously now measures approximately 9.1x 3.4 cm, slightly decreased in size. Reference measurement of the left fallopian tube measures 1.7 cm in diameter, previously having measured 2.6 cm. Impression: 1. Persistent but improving tubo-ovarian abscesses and pyosalpinx as described above. 2. Secondary inflammatory change of the mid sigmoid colon related to the adjacent tubo-ovarian abscesses.  Interventional Radiology has been asked to evaluate this patient for image-guided percutaneous aspirations with possible drain placements of the pelvic fluid collections.   Past Medical History:  Diagnosis Date  . Abnormal Pap smear of cervix   . Anemia   . Anxiety   . Bacterial vaginosis   . Depression   . GERD (gastroesophageal reflux disease)   . Headache   . TOA (tubo-ovarian abscess) 05/28/2020    Past Surgical History:  Procedure Laterality Date  . abscess      . DENTAL SURGERY      Allergies: Penicillins and Morphine and related  Medications: Prior to Admission medications   Medication Sig Start Date End Date Taking? Authorizing Provider  alum & mag hydroxide-simeth (MAALOX/MYLANTA) 200-200-20 MG/5ML suspension Take 30 mLs by mouth every 6 (six) hours as needed for indigestion or heartburn.   Yes [provider]  bismuth subsalicylate (PEPTO BISMOL) 262 MG/15ML suspension Take 30 mLs by mouth every 6 (six) hours as needed for indigestion.   Yes [provider]  ferrous sulfate 325 (65 FE) MG tablet Take 1 tablet (325 mg total) by mouth daily with breakfast. Reported on 11/30/2015 08/22/19  Yes Fayrene Helperran, Bowie, PA-C  ibuprofen (ADVIL) 200 MG tablet Take 400 mg by mouth every 6 (six) hours as needed for moderate pain.   Yes [provider]  methocarbamol (ROBAXIN) 500 MG tablet Take 1 tablet (500 mg total) by mouth 2 (two) times daily. Patient not taking: Reported on 05/25/2020 10/24/16   Rolland PorterJames, Mark, MD  predniSONE (DELTASONE) 20 MG tablet Take 1 tablet (20 mg total) by mouth 2 (two) times daily with a meal. Patient not taking: Reported on 05/25/2020 10/24/16   Rolland PorterJames, Mark, MD     History reviewed. No pertinent family history.  Social History   Socioeconomic History  . Marital status: Married    Spouse name: Not on file  . Number of children: Not on file  . Years of education: Not on file  . Highest education level: Not on file  Occupational History  . Not on file  Tobacco Use  . Smoking status: Current Every Day Smoker    Packs/day: 2.00    Types: Cigarettes  .  Smokeless tobacco: Never Used  Vaping Use  . Vaping Use: Never used  Substance and Sexual Activity  . Alcohol use: Yes    Comment: drinks daily, 2- 40oz today   . Drug use: No  . Sexual activity: Yes    Birth control/protection: None  Other Topics Concern  . Not on file  Social History Narrative   ** Merged History Encounter **       Social  Determinants of Health   Financial Resource Strain:   . Difficulty of Paying Living Expenses:   Food Insecurity:   . Worried About Programme researcher, broadcasting/film/video in the Last Year:   . Barista in the Last Year:   Transportation Needs:   . Freight forwarder (Medical):   Marland Kitchen Lack of Transportation (Non-Medical):   Physical Activity:   . Days of Exercise per Week:   . Minutes of Exercise per Session:   Stress:   . Feeling of Stress :   Social Connections:   . Frequency of Communication with Friends and Family:   . Frequency of Social Gatherings with Friends and Family:   . Attends Religious Services:   . Active Member of Clubs or Organizations:   . Attends Banker Meetings:   Marland Kitchen Marital Status:     Review of Systems: A 12 point ROS discussed and pertinent positives are indicated in the HPI above.  All other systems are negative.  Review of Systems  Constitutional: Positive for appetite change. Negative for activity change and fatigue.  Respiratory: Negative for cough and shortness of breath.   Cardiovascular: Positive for chest pain. Negative for leg swelling.       Chest wall pain during inspiration  Gastrointestinal: Positive for abdominal pain and nausea. Negative for diarrhea and vomiting.       Nausea with food intake. Lower abdominal/pelvic pain.   Musculoskeletal: Positive for back pain.       Lower back pain  Neurological: Positive for headaches.    Vital Signs: BP 135/87 (BP Location: Left Arm)   Pulse (!) 104   Temp 98.2 F (36.8 C) (Oral)   Resp 18   Ht 5\' 1"  (1.549 m)   Wt 125 lb (56.7 kg)   SpO2 98%   BMI 23.62 kg/m   Physical Exam Constitutional:      General: She is not in acute distress.    Appearance: She is well-developed.  HENT:     Mouth/Throat:     Mouth: Mucous membranes are moist.  Cardiovascular:     Rate and Rhythm: Normal rate and regular rhythm.     Heart sounds: Normal heart sounds.  Pulmonary:     Effort: Pulmonary  effort is normal.     Breath sounds: Normal breath sounds.  Abdominal:     General: Bowel sounds are increased. There is no distension.     Palpations: Abdomen is soft.     Tenderness: There is abdominal tenderness in the right lower quadrant and left lower quadrant.  Skin:    General: Skin is warm and dry.  Neurological:     Mental Status: She is alert and oriented to person, place, and time.     Imaging: CT ABDOMEN PELVIS W CONTRAST  Result Date: 05/28/2020 CLINICAL DATA:  Tubo-ovarian abscess, febrile EXAM: CT ABDOMEN AND PELVIS WITH CONTRAST TECHNIQUE: Multidetector CT imaging of the abdomen and pelvis was performed using the standard protocol following bolus administration of intravenous contrast. CONTRAST:  07/29/2020 OMNIPAQUE  IOHEXOL 300 MG/ML  SOLN COMPARISON:  05/25/2020 FINDINGS: Lower chest: Trace bilateral pleural effusions have developed in the interim. Hypoventilatory changes are seen at the lung bases. Hepatobiliary: No focal liver abnormality is seen. No gallstones, gallbladder wall thickening, or biliary dilatation. Pancreas: Unremarkable. No pancreatic ductal dilatation or surrounding inflammatory changes. Spleen: Normal in size without focal abnormality. Adrenals/Urinary Tract: Adrenal glands are unremarkable. Kidneys are normal, without renal calculi, focal lesion, or hydronephrosis. Bladder is unremarkable. Stomach/Bowel: No bowel obstruction or ileus. Mild wall edema of the sigmoid colon likely reflects secondary inflammation from adjacent inflammatory adnexal processes. Normal appendix right lower quadrant. Vascular/Lymphatic: Minimal atherosclerosis of the abdominal aorta. Reactive subcentimeter iliac chain lymph nodes are stable. No pathologic adenopathy. Reproductive: Tubular fluid-filled structures are again seen within the bilateral adnexae, left greater than right. On the right, fluid-filled fallopian tube measures up to 0.6 cm in diameter, previously having measured 1.2 cm.  On the left, the large tubo-ovarian abscess seen previously now measures approximately 9.1 x 3.4 cm, slightly decreased in size. Reference measurement of the left fallopian tube measures 1.7 cm in diameter, previously having measured 2.6 cm. Uterus is unremarkable. Other: Trace free fluid in the pelvis.  No free intraperitoneal gas. Musculoskeletal: No acute or destructive bony lesions. Reconstructed images demonstrate no additional findings. IMPRESSION: 1. Persistent but improving tubo-ovarian abscesses and pyosalpinx as described above. 2. Secondary inflammatory change of the mid sigmoid colon related to the adjacent tubo-ovarian abscesses. Electronically Signed   By: Sharlet Salina M.D.   On: 05/28/2020 19:47   CT ABDOMEN PELVIS W CONTRAST  Result Date: 05/25/2020 CLINICAL DATA:  Lower abdominal pain for 5 days. EXAM: CT ABDOMEN AND PELVIS WITH CONTRAST TECHNIQUE: Multidetector CT imaging of the abdomen and pelvis was performed using the standard protocol following bolus administration of intravenous contrast. CONTRAST:  OMNIPAQUE IOHEXOL 300 MG/ML  SOLN COMPARISON:  CT scan of the abdomen and pelvis dated 12/23/2009 FINDINGS: Lower chest: No acute abnormality. Hepatobiliary: No focal liver abnormality is seen. Hepatomegaly. No gallstones, gallbladder wall thickening, or biliary dilatation. Pancreas: Unremarkable. No pancreatic ductal dilatation or surrounding inflammatory changes. Spleen: Normal in size without focal abnormality. Adrenals/Urinary Tract: Adrenal glands are unremarkable. Kidneys are normal, without renal calculi, focal lesion, or hydronephrosis. Bladder is unremarkable. Stomach/Bowel: No dilated loops of large or small bowel. Prominent mucosa in the fundus of the stomach. There are few diverticula in the ascending colon. Slight prominence of the mucosa of the terminal ileum. Appendix is normal. Vascular/Lymphatic: Aortic atherosclerosis. No enlarged abdominal or pelvic lymph nodes.  Reproductive: The patient has a left tubo-ovarian abscess and a right pyosalpinx. The uterus is normal. Other: Small amount of free fluid in the pelvic cul-de-sac. Musculoskeletal: No acute or significant osseous findings. IMPRESSION: 1. Left tubal ovarian abscess. Right pyosalpinx. 2. Hepatomegaly. 3. Aortic atherosclerosis. Aortic Atherosclerosis (ICD10-I70.0). Electronically Signed   By: Francene Boyers M.D.   On: 05/25/2020 12:32   DG Chest Port 1 View  Result Date: 05/25/2020 CLINICAL DATA:  Fever today. EXAM: PORTABLE CHEST 1 VIEW COMPARISON:  10/24/2016 FINDINGS: The heart size and mediastinal contours are within normal limits. Both lungs are clear. The visualized skeletal structures are unremarkable. IMPRESSION: No acute cardiopulmonary findings. Electronically Signed   By: Rudie Meyer M.D.   On: 05/25/2020 09:26    Labs:  CBC: Recent Labs    05/26/20 0251 05/27/20 0312 05/28/20 0252 05/29/20 0255  WBC 23.9* 20.3* 16.3* 11.4*  HGB 9.3* 9.5* 10.0* 11.6*  HCT 28.4* 29.1*  30.8* 34.3*  PLT 453* 422* 426* 496*    COAGS: Recent Labs    05/25/20 0849  INR 1.2  APTT 29    BMP: Recent Labs    08/22/19 0916 05/25/20 0849 05/26/20 0251 05/29/20 0255  NA 142 136 134* 140  K 4.3 3.3* 3.7 3.2*  CL 110 102 102 103  CO2 21* 20* 23 27  GLUCOSE 99 197* 86 100*  BUN 7 8 6  <5*  CALCIUM 8.8* 8.3* 7.3* 8.1*  CREATININE 0.73 1.23* 0.90 0.80  GFRNONAA >60 51* >60 >60  GFRAA >60 60* >60 >60    LIVER FUNCTION TESTS: Recent Labs    05/25/20 0849 05/26/20 0251  BILITOT 0.5 <0.1*  AST 120* 24  ALT 23 16  ALKPHOS 259* 123  PROT 5.7* 4.9*  ALBUMIN 2.2* 1.8*    TUMOR MARKERS: No results for input(s): AFPTM, CEA, CA199, CHROMGRNA in the last 8760 hours.  Assessment and Plan:  Tubo-ovarian abscesses and pyosalpinx: 05/28/20, 49 year old female, is scheduled to be seen at the [facility] Interventional Radiology department today for pelvic fluid aspirations with  possible drain placements. Dr[facility] has approved this case.   Risks and benefits discussed with the patient and patient's husband including bleeding, infection, damage to adjacent structures, bowel perforation/fistula connection, and sepsis.  All of the patient's questions were answered, patient is agreeable to proceed.  Patient has been NPO, labs and vitals have been reviewed. She is not on any blood thinning medications. She is currently receiving 3 g IV Unasyn every 8 hours.   Consent signed and in chart.  Thank you for this interesting consult.  I greatly enjoyed meeting Brenleigh Collet and look forward to participating in their care.  A copy of this report was sent to the requesting provider on this date.  Electronically Signed: Marlou Parsons, AGACNP-BC [phone] 05/29/2020, 8:35 AM   I spent a total of 20 Minutes in face to face in clinical consultation, greater than 50% of which was counseling/coordinating care for pelvic fluid aspirations with possible drain placements

## 2020-05-29 NOTE — Plan of Care (Signed)

## 2020-05-29 NOTE — Procedures (Signed)
Interventional Radiology Procedure Note  Procedure: Image guided drain placement, left TOA, anterior approach.  46F pigtail drain.  Complications: None  EBL: None Sample: Culture sent  Recommendations: - Routine drain care, with sterile flushes, record output - follow up Cx - routine wound care  Signed,  Yvone Neu. Loreta Ave, DO

## 2020-05-29 NOTE — Discharge Summary (Signed)
Physician Discharge Summary  Patient ID: [name] MRN: [id number] DOB/AGE: 08-07-1971 49 y.o.  Admit date: 05/25/2020 Discharge date: 05/30/2020  Admission Diagnoses: Abdominal pain & fevers, bilateral hydrosalpinges, left TOA  Discharge Diagnoses: Resolving pain and TOA, pre-diabetes, history of dark stools  Discharged Condition: good  Hospital Course: Patient admitted from the ED with fevers, abdominal pain and left TOA. She was started on Zosyn and flagyl and her SIRS resolved with IV antibiotics and fluid hydration. Her blood cultures came back + for pan sensitive e.coli and ID recommended changing to Unasyn and then doing PO augmentin.  She was improving but not as well as expected so IR was consulted on 7/2 and she had only slightly smaller abscesses noted so drainage was done with 8mL removed and a drain placed. She was then transitioned to Augmentin. She continued to do well and was discharged  IR to arrange follow up for drain. 1-2 weeks per IR  Consults: Interventional Radiology, Infectious Disease  Significant Diagnostic Studies: CT abdomen and pelvis, blood cultures  Treatments: IV antibiotics and IR drainage.   Discharge Exam: Blood pressure 136/88, pulse 90, temperature 98.9 F (37.2 C), temperature source Oral, resp. rate 17, height 5\' 1"  (1.549 m), weight 56.7 kg, SpO2 98 %. General appearance: alert Resp: clear to auscultation bilaterally Cardio: regular rate and rhythm, S1, S2 normal, no murmur, click, rub or gallop GI: soft, non-tender; bowel sounds normal; no masses,  no organomegaly and IR draing in lower abdomen with c/d/i dressing. Sero-sang fluid approximately 5 mL noted Extremities: extremities normal, atraumatic, no cyanosis or edema Skin: Skin color, texture, turgor normal. No rashes or lesions Neurologic: Grossly normal  Disposition: Discharge disposition: 01-Home or Self Care       Discharge Instructions    Discharge patient   Complete by:  As directed    Discharge disposition: 01-Home or Self Care   Discharge patient date: 05/30/2020     Allergies as of 05/30/2020      Reactions   Penicillins Other (See Comments)   Childhood reaction. Tolerated Zosyn and Unasyn.    Morphine And Related Itching, Rash      Medication List    STOP taking these medications   alum & mag hydroxide-simeth 200-200-20 MG/5ML suspension Commonly known as: MAALOX/MYLANTA   bismuth subsalicylate 262 MG/15ML suspension Commonly known as: PEPTO BISMOL   ferrous sulfate 325 (65 FE) MG tablet   ibuprofen 200 MG tablet Commonly known as: ADVIL   methocarbamol 500 MG tablet Commonly known as: ROBAXIN   predniSONE 20 MG tablet Commonly known as: DELTASONE     TAKE these medications   amoxicillin-clavulanate 875-125 MG tablet Commonly known as: Augmentin Take 1 tablet by mouth 2 (two) times daily for 14 days. Notes to patient: **NEW** To treat infection. Very important to take this twice daily until all gone   ondansetron 4 MG disintegrating tablet Commonly known as: Zofran ODT Take 1 tablet (4 mg total) by mouth every 6 (six) hours as needed for nausea.   oxyCODONE 5 MG immediate release tablet Commonly known as: Oxy IR/ROXICODONE Take 1-2 tablets (5-10 mg total) by mouth every 4 (four) hours as needed for moderate pain or severe pain. Notes to patient: **NEW** For severe pain. May cause drowsiness, dizziness and constipation. May use over-the-counter docusate, miralax or senna for constipation   polyethylene glycol 17 g packet Commonly known as: MIRALAX / GLYCOLAX Take 17 g by mouth daily as needed. Notes to patient: **NEW** To treat constipation  simethicone 80 MG chewable tablet Commonly known as: MYLICON Chew 1 tablet (80 mg total) by mouth 4 (four) times daily as needed for flatulence (gas). Notes to patient: **NEW** For gas or bloating       Follow-up Information    [facility]. Schedule an appointment as soon as possible for a visit in 2 week(s).   Specialty: Obstetrics and Gynecology Why: If you haven't heard from our offices by this upcoming Wednesday, call for an appointment Contact information: [address] [place] [address] [phone]            Request sent to [facility] for follow up visit in 2-3 weeks  Signed: Scheryl Darter 05/30/2020, 2:05 PM

## 2020-05-30 NOTE — Progress Notes (Signed)
Referring Physician(s): Arnold,J/Pickens,C  Supervising Physician: Ruel Favors  Patient Status:  [facility] - In-pt  Chief Complaint:  Abdominal/pelvic pain/tubo-ovarian abscess  Subjective: Patient feeling better since tubo-ovarian abscess drained yesterday; plans are for discharge home today.  Denies nausea or vomiting.   Allergies: Penicillins and Morphine and related  Medications: Prior to Admission medications   Medication Sig Start Date End Date Taking? Authorizing Provider  alum & mag hydroxide-simeth (MAALOX/MYLANTA) 200-200-20 MG/5ML suspension Take 30 mLs by mouth every 6 (six) hours as needed for indigestion or heartburn.   Yes [provider]  bismuth subsalicylate (PEPTO BISMOL) 262 MG/15ML suspension Take 30 mLs by mouth every 6 (six) hours as needed for indigestion.   Yes [provider]  ferrous sulfate 325 (65 FE) MG tablet Take 1 tablet (325 mg total) by mouth daily with breakfast. Reported on 11/30/2015 08/22/19  Yes Fayrene Helper, PA-C  ibuprofen (ADVIL) 200 MG tablet Take 400 mg by mouth every 6 (six) hours as needed for moderate pain.   Yes [provider]  amoxicillin-clavulanate (AUGMENTIN) 875-125 MG tablet Take 1 tablet by mouth 2 (two) times daily for 14 days. 05/29/20 06/12/20  [place] Bing, MD  methocarbamol (ROBAXIN) 500 MG tablet Take 1 tablet (500 mg total) by mouth 2 (two) times daily. Patient not taking: Reported on 05/25/2020 10/24/16   Rolland Porter, MD  ondansetron (ZOFRAN ODT) 4 MG disintegrating tablet Take 1 tablet (4 mg total) by mouth every 6 (six) hours as needed for nausea. 05/29/20   [place] Bing, MD  oxyCODONE (OXY IR/ROXICODONE) 5 MG immediate release tablet Take 1-2 tablets (5-10 mg total) by mouth every 4 (four) hours as needed for moderate pain or severe pain. 05/29/20   [place] Bing, MD  polyethylene glycol (MIRALAX / GLYCOLAX) 17 g packet Take 17 g by mouth daily as needed. 05/29/20   [place] Bing, MD    predniSONE (DELTASONE) 20 MG tablet Take 1 tablet (20 mg total) by mouth 2 (two) times daily with a meal. Patient not taking: Reported on 05/25/2020 10/24/16   Rolland Porter, MD  simethicone (MYLICON) 80 MG chewable tablet Chew 1 tablet (80 mg total) by mouth 4 (four) times daily as needed for flatulence (gas). 05/29/20   [place] Bing, MD     Vital Signs: BP 136/88 (BP Location: Left Arm)   Pulse 90   Temp 98.9 F (37.2 C) (Oral)   Resp 17   Ht 5\' 1"  (1.549 m)   Wt 125 lb (56.7 kg)   SpO2 98%   BMI 23.62 kg/m   Physical Exam awake, alert.  Left abdominal drain intact, insertion site okay, mildly tender to palpation, output 45 cc blood-tinged fluid. Imaging: CT ABDOMEN PELVIS W CONTRAST  Result Date: 05/28/2020 CLINICAL DATA:  Tubo-ovarian abscess, febrile EXAM: CT ABDOMEN AND PELVIS WITH CONTRAST TECHNIQUE: Multidetector CT imaging of the abdomen and pelvis was performed using the standard protocol following bolus administration of intravenous contrast. CONTRAST:  07/29/2020 OMNIPAQUE IOHEXOL 300 MG/ML  SOLN COMPARISON:  05/25/2020 FINDINGS: Lower chest: Trace bilateral pleural effusions have developed in the interim. Hypoventilatory changes are seen at the lung bases. Hepatobiliary: No focal liver abnormality is seen. No gallstones, gallbladder wall thickening, or biliary dilatation. Pancreas: Unremarkable. No pancreatic ductal dilatation or surrounding inflammatory changes. Spleen: Normal in size without focal abnormality. Adrenals/Urinary Tract: Adrenal glands are unremarkable. Kidneys are normal, without renal calculi, focal lesion, or hydronephrosis. Bladder is unremarkable. Stomach/Bowel: No bowel obstruction or ileus. Mild wall edema of the  sigmoid colon likely reflects secondary inflammation from adjacent inflammatory adnexal processes. Normal appendix right lower quadrant. Vascular/Lymphatic: Minimal atherosclerosis of the abdominal aorta. Reactive subcentimeter iliac chain lymph nodes are  stable. No pathologic adenopathy. Reproductive: Tubular fluid-filled structures are again seen within the bilateral adnexae, left greater than right. On the right, fluid-filled fallopian tube measures up to 0.6 cm in diameter, previously having measured 1.2 cm. On the left, the large tubo-ovarian abscess seen previously now measures approximately 9.1 x 3.4 cm, slightly decreased in size. Reference measurement of the left fallopian tube measures 1.7 cm in diameter, previously having measured 2.6 cm. Uterus is unremarkable. Other: Trace free fluid in the pelvis.  No free intraperitoneal gas. Musculoskeletal: No acute or destructive bony lesions. Reconstructed images demonstrate no additional findings. IMPRESSION: 1. Persistent but improving tubo-ovarian abscesses and pyosalpinx as described above. 2. Secondary inflammatory change of the mid sigmoid colon related to the adjacent tubo-ovarian abscesses. Electronically Signed   By: Sharlet Salina M.D.   On: 05/28/2020 19:47   CT IMAGE GUIDED FLUID DRAIN BY CATHETER  Result Date: 05/29/2020 INDICATION: 49 year old female with a history of left-sided TOA referred for drainage EXAM: CT GUIDED DRAINAGE OF TUBO-OVARIAN ABSCESS MEDICATIONS: The patient is currently admitted to the hospital and receiving intravenous antibiotics. The antibiotics were administered within an appropriate time frame prior to the initiation of the procedure. ANESTHESIA/SEDATION: 2.0 mg IV Versed 100 mcg IV Fentanyl Moderate Sedation Time:  19 minutes The patient was continuously monitored during the procedure by the interventional radiology nurse under my direct supervision. COMPLICATIONS: None TECHNIQUE: Informed written consent was obtained from the patient after a thorough discussion of the procedural risks, benefits and alternatives. All questions were addressed. Maximal Sterile Barrier Technique was utilized including caps, mask, sterile gowns, sterile gloves, sterile drape, hand hygiene and  skin antiseptic. A timeout was performed prior to the initiation of the procedure. PROCEDURE: The left lower quadrant was prepped with chlorhexidine in a sterile fashion, and a sterile drape was applied covering the operative field. A sterile gown and sterile gloves were used for the procedure. Local anesthesia was provided with 1% Lidocaine. Once the patient is prepped and draped in the usual sterile fashion 1% lidocaine was used for local anesthesia. Using CT guidance, we advanced an 18 gauge trocar needle into the abscess of the left pelvis. Once we confirmed needle tip position, stylet was removed and we aspirated small amount of pus to confirm location. Modified Seldinger technique was used to place a 12 [place] drain. Upon placement of 12 French drain, no additional purulent material was aspirated. The drain was withdrawn and a second needle position was used for drain placement. CT was again used with the same incision site to place a needle into an alternative site within the abscess. Modified Seldinger technique was again used to place a 12 [place] drain into the abscess. With this drain placement, approximately 30 cc of purulent material aspirated. We did not completely form the pigtail catheter given the patient's exquisite tenderness with manipulation. Catheter was sutured in position and attached to bulb suction. Final image was stored. Patient tolerated the procedure well and remained hemodynamically stable throughout. No complications were encountered and no significant blood loss. FINDINGS: CT demonstrates ill-defined mass in the left lower quadrant corresponding to the patient's known TOA. Final drain position demonstrates the drain located within the left-sided TOA. Approximately 30 cc of purulent material was aspirated. IMPRESSION: Status post CT-guided drainage of left tubo-ovarian abscess. Signed, Yvone Neu. Loreta Ave,  DO, RPVI [facility]  Electronically Signed   By: Gilmer Mor D.O.   On: 05/29/2020 14:18    Labs:  CBC: Recent Labs    05/26/20 0251 05/27/20 0312 05/28/20 0252 05/29/20 0255  WBC 23.9* 20.3* 16.3* 11.4*  HGB 9.3* 9.5* 10.0* 11.6*  HCT 28.4* 29.1* 30.8* 34.3*  PLT 453* 422* 426* 496*    COAGS: Recent Labs    05/25/20 0849  INR 1.2  APTT 29    BMP: Recent Labs    08/22/19 0916 05/25/20 0849 05/26/20 0251 05/29/20 0255  NA 142 136 134* 140  K 4.3 3.3* 3.7 3.2*  CL 110 102 102 103  CO2 21* 20* 23 27  GLUCOSE 99 197* 86 100*  BUN 7 8 6  <5*  CALCIUM 8.8* 8.3* 7.3* 8.1*  CREATININE 0.73 1.23* 0.90 0.80  GFRNONAA >60 51* >60 >60  GFRAA >60 60* >60 >60    LIVER FUNCTION TESTS: Recent Labs    05/25/20 0849 05/26/20 0251  BILITOT 0.5 <0.1*  AST 120* 24  ALT 23 16  ALKPHOS 259* 123  PROT 5.7* 4.9*  ALBUMIN 2.2* 1.8*    Assessment and Plan: Patient with history of left-sided tubo-ovarian abscess, status post drain placement on 7/2; afebrile, drain fluid cultures growing few E. Coli; plan is for patient to be discharged home with drain today; recommend once daily irrigation of drain with 5 cc sterile normal saline, output recording and dressing changes as needed.  Patient given prescription for saline flushes as well as output card and IR clinic number; site care instructions reviewed with patient ;she will be scheduled for follow-up in IR clinic in 1-2 weeks.    Electronically Signed: D. 9/2, PA-C 05/30/2020, 1:00 PM   I spent a total of 15 minutes at the the patient's bedside AND on the patient's hospital floor or unit, greater than 50% of which was counseling/coordinating care for tubo-ovarian abscess drain    Patient ID: 07/31/2020, female   DOB: 02-07-1971, 49 y.o.   MRN: [id number]

## 2020-05-30 NOTE — Plan of Care (Signed)

## 2020-05-30 NOTE — Progress Notes (Signed)
Marlou Sa to be D/C'd  per MD order. Discussed with the patient and all questions fully answered.  VSS, Skin clean, dry and intact without evidence of skin break down, no evidence of skin tears noted.  IV catheter discontinued intact. Site without signs and symptoms of complications. Dressing and pressure applied.  An After Visit Summary was printed and given to the patient. Patient received prescription.  D/c education completed with patient/family including follow up instructions, medication list, d/c activities limitations if indicated, with other d/c instructions as indicated by MD - patient able to verbalize understanding, all questions fully answered.   JP teaching given, no question verbalized.  Patient instructed to return to ED, call 911, or call MD for any changes in condition.   Patient to be escorted via WC, and D/C home via private auto.

## 2020-06-02 ENCOUNTER — Other Ambulatory Visit: Payer: Self-pay | Admitting: Obstetrics and Gynecology

## 2020-06-02 DIAGNOSIS — N7093 Salpingitis and oophoritis, unspecified: Secondary | ICD-10-CM

## 2020-06-03 LAB — AEROBIC/ANAEROBIC CULTURE W GRAM STAIN (SURGICAL/DEEP WOUND)

## 2020-06-09 ENCOUNTER — Ambulatory Visit
Admission: RE | Admit: 2020-06-09 | Discharge: 2020-06-09 | Disposition: A | Discharge status: Home / Self Care | Payer: Self-pay | Source: Ambulatory Visit | Attending: Radiology | Admitting: Radiology

## 2020-06-09 ENCOUNTER — Telehealth: Payer: Self-pay | Admitting: Obstetrics and Gynecology

## 2020-06-09 ENCOUNTER — Encounter: Payer: Self-pay | Admitting: *Deleted

## 2020-06-09 ENCOUNTER — Ambulatory Visit
Admission: RE | Admit: 2020-06-09 | Discharge: 2020-06-09 | Disposition: A | Discharge status: Home / Self Care | Payer: Self-pay | Source: Ambulatory Visit | Attending: Obstetrics and Gynecology | Admitting: Obstetrics and Gynecology

## 2020-06-09 DIAGNOSIS — N7093 Salpingitis and oophoritis, unspecified: Secondary | ICD-10-CM

## 2020-06-09 HISTORY — PX: IR RADIOLOGIST EVAL & MGMT: IMG5224

## 2020-06-09 MED ORDER — IOPAMIDOL (ISOVUE-300) INJECTION 61%
100.0000 mL | Freq: Once | INTRAVENOUS | Status: AC | PRN
  Administered 2020-06-09: 100 mL via INTRAVENOUS

## 2020-06-09 NOTE — Telephone Encounter (Signed)
Attempted to contact patient to give her f/u appointment information with Dr. Vergie Living. No answer, left voicemail for patient to give the office a call back

## 2020-06-09 NOTE — Progress Notes (Signed)
Referring Physician(s): Dr [place] Bing  Chief Complaint: The patient is seen in follow up today s/p CT-guided drainage of left tubo-ovarian abscess placed in IR 7/2 in IR with Dr Loreta Ave  History of present illness:  Tubo ovarian abscess Drain placed 7/2 Here now for CT and evaluation of abscess/drain  Denies fever; chills Denies pain-- unless flushing drain Very painful to flush--  OP has been dark yellow for length of existence-- until last few days Seems almost green color OP is minimal-- less than 5 cc daily Has not finished Augmentin yet Has approx 3 days remaining in bottle  Does not have appt with Dr Vergie Living at this time She is to call him today   Past Medical History:  Diagnosis Date  . Abnormal Pap smear of cervix   . Anemia   . Anxiety   . Bacterial vaginosis   . Depression   . GERD (gastroesophageal reflux disease)   . Headache   . TOA (tubo-ovarian abscess) 05/28/2020    Past Surgical History:  Procedure Laterality Date  . abscess    . DENTAL SURGERY      Allergies: Penicillins and Morphine and related  Medications: Prior to Admission medications   Medication Sig Start Date End Date Taking? Authorizing Provider  amoxicillin-clavulanate (AUGMENTIN) 875-125 MG tablet Take 1 tablet by mouth 2 (two) times daily for 14 days. 05/29/20 06/12/20  [place] Bing, MD  ondansetron (ZOFRAN ODT) 4 MG disintegrating tablet Take 1 tablet (4 mg total) by mouth every 6 (six) hours as needed for nausea. 05/29/20   [place] Bing, MD  oxyCODONE (OXY IR/ROXICODONE) 5 MG immediate release tablet Take 1-2 tablets (5-10 mg total) by mouth every 4 (four) hours as needed for moderate pain or severe pain. 05/29/20   [place] Bing, MD  polyethylene glycol (MIRALAX / GLYCOLAX) 17 g packet Take 17 g by mouth daily as needed. 05/29/20   [place] Bing, MD  simethicone (MYLICON) 80 MG chewable tablet Chew 1 tablet (80 mg total) by mouth 4 (four) times daily as needed for  flatulence (gas). 05/29/20    Bing, MD     No family history on file.  Social History   Socioeconomic History  . Marital status: Married    Spouse name: Not on file  . Number of children: Not on file  . Years of education: Not on file  . Highest education level: Not on file  Occupational History  . Not on file  Tobacco Use  . Smoking status: Current Every Day Smoker    Packs/day: 2.00    Types: Cigarettes  . Smokeless tobacco: Never Used  Vaping Use  . Vaping Use: Never used  Substance and Sexual Activity  . Alcohol use: Yes    Comment: drinks daily, 2- 40oz today   . Drug use: No  . Sexual activity: Yes    Birth control/protection: None  Other Topics Concern  . Not on file  Social History Narrative   ** Merged History Encounter **       Social Determinants of Health   Financial Resource Strain:   . Difficulty of Paying Living Expenses:   Food Insecurity:   . Worried About Programme researcher, broadcasting/film/video in the Last Year:   . Barista in the Last Year:   Transportation Needs:   . Freight forwarder (Medical):   Marland Kitchen Lack of Transportation (Non-Medical):   Physical Activity:   . Days of Exercise per Week:   . Minutes of  Exercise per Session:   Stress:   . Feeling of Stress :   Social Connections:   . Frequency of Communication with Friends and Family:   . Frequency of Social Gatherings with Friends and Family:   . Attends Religious Services:   . Active Member of Clubs or Organizations:   . Attends Banker Meetings:   Marland Kitchen Marital Status:      Vital Signs: There were no vitals taken for this visit.  Physical Exam Skin:    General: Skin is warm.     Comments: Site is clean and dry NT to touch Flushing maneuver is quite painful (was able to flush in maybe 1 cc-- too painful) Unable to aspirate much at all  CT today shows resolution of abscess collection per Dr Caryn Bee removed without complication per Dr Lowella Dandy order Dressing  placed  Neurological:     Mental Status: She is alert.     Imaging: No results found.  Labs:  CBC: Recent Labs    05/26/20 0251 05/27/20 0312 05/28/20 0252 05/29/20 0255  WBC 23.9* 20.3* 16.3* 11.4*  HGB 9.3* 9.5* 10.0* 11.6*  HCT 28.4* 29.1* 30.8* 34.3*  PLT 453* 422* 426* 496*    COAGS: Recent Labs    05/25/20 0849  INR 1.2  APTT 29    BMP: Recent Labs    08/22/19 0916 05/25/20 0849 05/26/20 0251 05/29/20 0255  NA 142 136 134* 140  K 4.3 3.3* 3.7 3.2*  CL 110 102 102 103  CO2 21* 20* 23 27  GLUCOSE 99 197* 86 100*  BUN 7 8 6  <5*  CALCIUM 8.8* 8.3* 7.3* 8.1*  CREATININE 0.73 1.23* 0.90 0.80  GFRNONAA >60 51* >60 >60  GFRAA >60 60* >60 >60    LIVER FUNCTION TESTS: Recent Labs    05/25/20 0849 05/26/20 0251  BILITOT 0.5 <0.1*  AST 120* 24  ALT 23 16  ALKPHOS 259* 123  PROT 5.7* 4.9*  ALBUMIN 2.2* 1.8*    Assessment:  Tubo ovarian abscess Drain placed 7/2 Resolution of abscess per imaging Drain removal without complication Pt is to follow with Dr 9/2 (to call office today) She has good understanding of plan  Signed: Vergie Living, PA-C 06/09/2020, 1:30 PM   Please refer to Dr. 06/11/2020 attestation of this note for management and plan.

## 2020-06-15 ENCOUNTER — Telehealth: Payer: Self-pay | Admitting: *Deleted

## 2020-06-15 NOTE — Telephone Encounter (Signed)
Pt left VM message stating that she is calling for Dr. Vergie Living. Pt stated that she was in the hospital from 6/27-7/3 and now is having a lot of pain on the other side. She also has fever and chills. I discussed with Dr. Vergie Living and he advised that pt needs to go to ED for evaluation. I returned pt's call and received her voicemail. I left a message stating that Dr. Vergie Living recommends she return to the ED for evaluation of her symptoms.

## 2020-06-16 ENCOUNTER — Other Ambulatory Visit: Payer: Self-pay

## 2020-06-25 ENCOUNTER — Ambulatory Visit (INDEPENDENT_AMBULATORY_CARE_PROVIDER_SITE_OTHER): Payer: Self-pay | Admitting: Obstetrics and Gynecology

## 2020-06-25 ENCOUNTER — Other Ambulatory Visit: Payer: Self-pay

## 2020-06-25 ENCOUNTER — Encounter: Payer: Self-pay | Admitting: Obstetrics and Gynecology

## 2020-06-25 VITALS — BP 135/97 | HR 90 | Wt 117.9 lb

## 2020-06-25 DIAGNOSIS — Z8742 Personal history of other diseases of the female genital tract: Secondary | ICD-10-CM

## 2020-06-25 DIAGNOSIS — N7093 Salpingitis and oophoritis, unspecified: Secondary | ICD-10-CM

## 2020-06-25 NOTE — Progress Notes (Signed)
Obstetrics and Gynecology Visit Return Patient Evaluation  Appointment Date: 06/25/2020  Primary Care Provider: [facility], [facility]  OB[facility]: [facility]  Primary Care Provider: [facility]  Chief Complaint: f/u TOA hospitalization  History of Present Illness:  [name] is a 49 y.o. with above CC 6/28-7/2. Pt discharged after IR drainage with drain removed on 7/13. Persistent mild b/l hydrosalpingx noted.   Interval History: Since that time, she states that she has no bleeding, systemic s/s. She occasional has sharp pain but then it goes away.   Review of Systems: as noted in the History of Present Illness.  Medications:  Marlou Sa had no medications administered during this visit. Current Outpatient Medications  Medication Sig Dispense Refill  . ibuprofen (ADVIL) 600 MG tablet Take 600 mg by mouth every 6 (six) hours as needed.    . ondansetron (ZOFRAN ODT) 4 MG disintegrating tablet Take 1 tablet (4 mg total) by mouth every 6 (six) hours as needed for nausea. 30 tablet 0  . simethicone (MYLICON) 80 MG chewable tablet Chew 1 tablet (80 mg total) by mouth 4 (four) times daily as needed for flatulence (gas). 30 tablet 0  . polyethylene glycol (MIRALAX / GLYCOLAX) 17 g packet Take 17 g by mouth daily as needed. (Patient not taking: Reported on 06/25/2020) 30 each 0   No current facility-administered medications for this visit.    Allergies: is allergic to penicillins and morphine and related.  Physical Exam:  BP (!) 135/97   Pulse 90   Wt 117 lb 14.4 oz (53.5 kg)   LMP 04/11/2020 (Approximate)   BMI 22.28 kg/m  Body mass index is 22.28 kg/m. General appearance: Well nourished, well developed female in no acute distress.  Abdomen: diffusely non tender to palpation, non distended, and no masses, hernias. Well healed drain site Neuro/Psych:  Normal mood and affect.     Assessment: pt  improving  Plan:  1. History of postmenopausal bleeding Recommend pap smear and embx in 6-8 weeks to have more of an interval for uterine instrumentation from the PID/TOA.  2. TOA (tubo-ovarian abscess) Follow up s/s. May reimage in 3-6 months to see if persistent hydro, especially if s/s but I told her that would definitely would to hold off on any surgical consideration for at least 3 months to let the infection and sequelae cool down as much as possible   RTC: 6-8wks for pap and embx  Cornelia Copa MD Attending Center for [facility])

## 2020-08-12 ENCOUNTER — Encounter: Payer: Self-pay | Admitting: Obstetrics and Gynecology

## 2020-08-12 ENCOUNTER — Other Ambulatory Visit (HOSPITAL_COMMUNITY)
Admission: RE | Admit: 2020-08-12 | Discharge: 2020-08-12 | Disposition: A | Discharge status: Home / Self Care | Payer: Self-pay | Source: Ambulatory Visit | Attending: Obstetrics and Gynecology | Admitting: Obstetrics and Gynecology

## 2020-08-12 ENCOUNTER — Ambulatory Visit (INDEPENDENT_AMBULATORY_CARE_PROVIDER_SITE_OTHER): Payer: Self-pay | Admitting: Obstetrics and Gynecology

## 2020-08-12 ENCOUNTER — Other Ambulatory Visit: Payer: Self-pay

## 2020-08-12 VITALS — BP 151/98 | HR 88 | Ht 62.0 in | Wt 121.0 lb

## 2020-08-12 DIAGNOSIS — Z8742 Personal history of other diseases of the female genital tract: Secondary | ICD-10-CM

## 2020-08-12 DIAGNOSIS — Z3202 Encounter for pregnancy test, result negative: Secondary | ICD-10-CM

## 2020-08-12 HISTORY — PX: ENDOMETRIAL BIOPSY: PRO73

## 2020-08-12 LAB — POCT PREGNANCY, URINE: Preg Test, Ur: NEGATIVE

## 2020-08-12 NOTE — Procedures (Signed)
Endometrial Biopsy Procedure Note  Pre-operative Diagnosis: History of post menopausal bleeding during TOA admission  Post-operative Diagnosis: same  Procedure Details  Urine pregnancy test was done and result was negative.  The risks (including infection, bleeding, pain, and uterine perforation) and benefits of the procedure were explained to the patient and Written informed consent was obtained.  The patient was placed in the dorsal lithotomy position.  Bimanual exam showed the uterus to be in the neutral position.  A Graves' speculum inserted in the vagina, and the cervix was visualized and appeared normal. The cervix was then prepped with povidone iodine, and a  pipelle was inserted into the uterine cavity and sounded the uterus to a depth of 7cm.  A Minimal amount of tissue was collected after 2 passes. The sample was sent for pathologic examination.  Condition: Stable  Complications: None  Plan: The patient was advised to call for any fever or for prolonged or severe pain or bleeding. She was advised to use OTC analgesics as needed for mild to moderate pain. She was advised to avoid vaginal intercourse for 48 hours or until the bleeding has completely stopped.  Patient is self pay. She was referred to [facility] for cervical cancer screening and mammogram screening  She is having hot flashes and night sweats. I d/w her re: behavioral interventions to help with this. Her pelvic pain is considerably better and continues to improve although she does get sharp pains when she gets hot flashes and night sweats.   Cornelia Copa MD Attending Center for [employer] [facility])

## 2020-08-17 LAB — SURGICAL PATHOLOGY

## 2020-09-17 ENCOUNTER — Encounter: Payer: Self-pay | Admitting: *Deleted

## 2020-11-06 ENCOUNTER — Encounter: Payer: Self-pay | Admitting: Obstetrics and Gynecology

## 2020-11-10 ENCOUNTER — Encounter: Payer: Self-pay | Admitting: Obstetrics and Gynecology

## 2021-02-03 NOTE — Progress Notes (Signed)
Opened in error - please disregard

## 2021-03-25 ENCOUNTER — Ambulatory Visit (HOSPITAL_COMMUNITY)
Admission: EM | Admit: 2021-03-25 | Discharge: 2021-03-25 | Disposition: A | Discharge status: Home / Self Care | Payer: Self-pay | Attending: Physician Assistant | Admitting: Physician Assistant

## 2021-03-25 ENCOUNTER — Encounter (HOSPITAL_COMMUNITY): Payer: Self-pay | Admitting: Emergency Medicine

## 2021-03-25 ENCOUNTER — Other Ambulatory Visit: Payer: Self-pay

## 2021-03-25 DIAGNOSIS — R55 Syncope and collapse: Secondary | ICD-10-CM | POA: Insufficient documentation

## 2021-03-25 DIAGNOSIS — R002 Palpitations: Secondary | ICD-10-CM | POA: Insufficient documentation

## 2021-03-25 DIAGNOSIS — R42 Dizziness and giddiness: Secondary | ICD-10-CM | POA: Insufficient documentation

## 2021-03-25 DIAGNOSIS — I10 Essential (primary) hypertension: Secondary | ICD-10-CM | POA: Insufficient documentation

## 2021-03-25 LAB — CBC
HCT: 42.4 % (ref 36.0–46.0)
Hemoglobin: 13.9 g/dL (ref 12.0–15.0)
MCH: 32 pg (ref 26.0–34.0)
MCHC: 32.8 g/dL (ref 30.0–36.0)
MCV: 97.7 fL (ref 80.0–100.0)
Platelets: 303 10*3/uL (ref 150–400)
RBC: 4.34 MIL/uL (ref 3.87–5.11)
RDW: 14.6 % (ref 11.5–15.5)
WBC: 6.6 10*3/uL (ref 4.0–10.5)
nRBC: 0 % (ref 0.0–0.2)

## 2021-03-25 LAB — COMPREHENSIVE METABOLIC PANEL
ALT: 23 U/L (ref 0–44)
AST: 29 U/L (ref 15–41)
Albumin: 3.8 g/dL (ref 3.5–5.0)
Alkaline Phosphatase: 80 U/L (ref 38–126)
Anion gap: 9 (ref 5–15)
BUN: 11 mg/dL (ref 6–20)
CO2: 24 mmol/L (ref 22–32)
Calcium: 9.2 mg/dL (ref 8.9–10.3)
Chloride: 105 mmol/L (ref 98–111)
Creatinine, Ser: 0.77 mg/dL (ref 0.44–1.00)
GFR, Estimated: >60 mL/min (ref >60)
Glucose, Bld: 100 mg/dL — ABNORMAL HIGH (ref 70–99)
Potassium: 4 mmol/L (ref 3.5–5.1)
Sodium: 138 mmol/L (ref 135–145)
Total Bilirubin: 0.3 mg/dL (ref 0.3–1.2)
Total Protein: 6.9 g/dL (ref 6.5–8.1)

## 2021-03-25 LAB — POCT URINALYSIS DIPSTICK, ED / UC
Bilirubin Urine: NEGATIVE
Glucose, UA: NEGATIVE mg/dL
Ketones, ur: NEGATIVE mg/dL
Leukocytes,Ua: NEGATIVE
Nitrite: NEGATIVE
Protein, ur: NEGATIVE mg/dL
Specific Gravity, Urine: <=1.005 (ref 1.005–1.030)
Urobilinogen, UA: 0.2 mg/dL (ref 0.0–1.0)
pH: 5 (ref 5.0–8.0)

## 2021-03-25 LAB — CBG MONITORING, ED: Glucose-Capillary: 93 mg/dL (ref 70–99)

## 2021-03-25 MED ORDER — AMLODIPINE BESYLATE 10 MG PO TABS: 10.0000 mg | ORAL_TABLET | Freq: Every day | ORAL | 0 refills | Status: DC

## 2021-03-25 NOTE — ED Triage Notes (Signed)
Pt presents today with c/o of palpitations, "hot flashes" and near syncope episode three days ago. Today she denies chest pain. +nausea, -v/d

## 2021-03-25 NOTE — Discharge Instructions (Addendum)
We will be in touch with your lab results as soon as we have them.  I started you on amlodipine for blood pressure.  It is important that you follow-up with your PCP or someone else within 1 week to have this rechecked.  If you are not able to see another provider in this timeframe please return so we can reevaluate you.  Make sure that you are drinking plenty of fluid and eat small frequent meals.  Call cardiology and PCP for appointments as we discussed.  If you have any episodes of passing out or additional symptoms including chest pain, shortness of breath, vision changes you need to go to the emergency room.

## 2021-03-25 NOTE — ED Provider Notes (Signed)
[facility]    CSN: [id number] Arrival date & time: 03/25/21  [id number]      History   Chief Complaint Chief Complaint  Patient presents with  . Hot Flashes  . Nausea  . Near Syncope  . Palpitations    HPI [name] is a 50 y.o. female.   Patient presents today with a 5852-month history of intermittent palpitations and near syncope.  Reports that symptoms have gradually been worsening and she had a brief syncopal episode a few days ago while at work.  She has not identified any triggers.  Denies any changes to diet, medications, recent head injury, recent illness.  Reports symptoms began after being released from the hospital in June/July 2021 following tubo-ovarian abscess.  She denies any history of thyroid condition, arrhythmia, cardiovascular disease.  She does drink alcohol but reports that this in moderation on the weekends.  She has a history of anemia and has not had any recent blood work; denies any abnormal bleeding including melena, hematochezia, menorrhagia.  She reports associated heat intolerance, palpitations, shortness of breath, nausea, lightheadedness with episodes.  She denies any increased stress or anxiety.  She is drinking plenty of fluid and eating small frequent meals throughout the day; denies any change to diet or eating habits.     Past Medical History:  Diagnosis Date  . Abnormal Pap smear of cervix   . Anemia   . Anxiety   . Bacterial vaginosis   . Depression   . GERD (gastroesophageal reflux disease)   . Headache   . TOA (tubo-ovarian abscess) 05/28/2020    Patient Active Problem List   Diagnosis Date Noted  . History of postmenopausal bleeding 06/25/2020  . Dark stools 05/26/2020  . Pre-diabetes 05/26/2020  . TOA (tubo-ovarian abscess) 05/25/2020  . Postmenopausal bleeding 05/25/2020  . Elevated alkaline phosphatase measurement 05/25/2020  . Adenomyosis 07/15/2015  . ETOH abuse 07/14/2015  . Abnormal uterine bleeding (AUB)  07/14/2015    Past Surgical History:  Procedure Laterality Date  . abscess    . DENTAL SURGERY    . ENDOMETRIAL BIOPSY  08/12/2020      . IR RADIOLOGIST EVAL & MGMT  06/09/2020    OB History    Gravida  4   Para  3   Term  3   Preterm  0   AB  1   Living  3     SAB  0   IAB  0   Ectopic  0   Multiple      Live Births           Obstetric Comments  Vag delivery x 3         Home Medications    Prior to Admission medications   Medication Sig Start Date End Date Taking? Authorizing Provider  amLODipine (NORVASC) 10 MG tablet Take 1 tablet (10 mg total) by mouth daily. 03/25/21  Yes Isaias Dowson, Noberto RetortErin K, PA-C    Family History Family History  Problem Relation Age of Onset  . Hypertension Mother   . Hypertension Father     Social History Social History   Tobacco Use  . Smoking status: Current Every Day Smoker    Packs/day: 2.00    Types: Cigarettes  . Smokeless tobacco: Never Used  Vaping Use  . Vaping Use: Never used  Substance Use Topics  . Alcohol use: Yes    Comment: drinks daily, 2- 40oz today   . Drug use: No  Allergies   Penicillins and Morphine and related   Review of Systems Review of Systems  Constitutional: Positive for activity change and fatigue. Negative for appetite change and fever.  HENT: Negative for congestion, sinus pressure, sneezing and sore throat.   Respiratory: Positive for shortness of breath. Negative for cough.   Cardiovascular: Positive for palpitations. Negative for chest pain and leg swelling.  Gastrointestinal: Positive for nausea. Negative for abdominal pain, diarrhea and vomiting.  Endocrine: Positive for heat intolerance. Negative for cold intolerance.  Musculoskeletal: Negative for arthralgias and myalgias.  Neurological: Positive for syncope, weakness and light-headedness. Negative for dizziness and headaches.     Physical Exam Triage Vital Signs ED Triage Vitals  Enc Vitals Group     BP 03/25/21  0833 (!) 163/101     Pulse Rate 03/25/21 0833 81     Resp 03/25/21 0833 18     Temp 03/25/21 0833 98.3 F (36.8 C)     Temp Source 03/25/21 0833 Oral     SpO2 03/25/21 0833 98 %     Weight --      Height --      Head Circumference --      Peak Flow --      Pain Score 03/25/21 0830 0     Pain Loc --      Pain Edu? --      Excl. in GC? --    Orthostatic VS for the past 24 hrs:  BP- Lying Pulse- Lying BP- Sitting Pulse- Sitting BP- Standing at 0 minutes Pulse- Standing at 0 minutes  03/25/21 0921 (!) 185/93 78 (!) 184/106 80 (!) 185/109 83    Updated Vital Signs BP (!) 163/101 (BP Location: Left Arm)   Pulse 81   Temp 98.3 F (36.8 C) (Oral)   Resp 18   LMP 04/11/2020 (Approximate)   SpO2 98%   Visual Acuity Right Eye Distance:   Left Eye Distance:   Bilateral Distance:    Right Eye Near:   Left Eye Near:    Bilateral Near:     Physical Exam Vitals reviewed.  Constitutional:      General: She is awake. She is not in acute distress.    Appearance: Normal appearance. She is not ill-appearing.     Comments: Very pleasant female appears stated age in no acute distress  HENT:     Head: Normocephalic and atraumatic.     Right Ear: Tympanic membrane, ear canal and external ear normal. No hemotympanum. Tympanic membrane is not retracted. Tympanic membrane has normal mobility.     Left Ear: Tympanic membrane, ear canal and external ear normal. No hemotympanum. Tympanic membrane is not retracted. Tympanic membrane has normal mobility.     Nose: Nose normal.     Mouth/Throat:     Tongue: Tongue does not deviate from midline.     Pharynx: Uvula midline. No oropharyngeal exudate or posterior oropharyngeal erythema.  Eyes:     Extraocular Movements: Extraocular movements intact.     Pupils: Pupils are equal, round, and reactive to light.  Neck:     Thyroid: No thyromegaly or thyroid tenderness.  Cardiovascular:     Rate and Rhythm: Normal rate and regular rhythm.     Heart  sounds: No murmur heard.   Pulmonary:     Effort: Pulmonary effort is normal.     Breath sounds: Normal breath sounds. No wheezing, rhonchi or rales.     Comments: Clear to auscultation bilaterally Abdominal:  Palpations: Abdomen is soft.     Tenderness: There is no abdominal tenderness.  Musculoskeletal:     Cervical back: No spinous process tenderness or muscular tenderness.     Comments: Strength 5/5 bilateral upper and lower extremities  Lymphadenopathy:     Head:     Right side of head: No submental, submandibular or tonsillar adenopathy.     Left side of head: No submental, submandibular or tonsillar adenopathy.  Neurological:     General: No focal deficit present.     Cranial Nerves: Cranial nerves are intact.     Motor: Motor function is intact.     Coordination: Coordination is intact.     Gait: Gait is intact.     Comments: Cranial nerves II through XII intact.  No focal neurological defect on exam.  Psychiatric:        Behavior: Behavior is cooperative.      UC Treatments / Results  Labs (all labs ordered are listed, but only abnormal results are displayed) Labs Reviewed  POCT URINALYSIS DIPSTICK, ED / UC - Abnormal; Notable for the following components:      Result Value   Hgb urine dipstick SMALL (*)    All other components within normal limits  URINE CULTURE  CBC  COMPREHENSIVE METABOLIC PANEL  CBG MONITORING, ED    EKG   Radiology No results found.  Procedures Procedures (including critical care time)  Medications Ordered in UC Medications - No data to display  Initial Impression / Assessment and Plan / UC Course  I have reviewed the triage vital signs and the nursing notes.  Pertinent labs & imaging results that were available during my care of the patient were reviewed by me and considered in my medical decision making (see chart for details).      EKG obtained showed sinus rhythm with ventricular rate of 80 bpm with nonspecific ST  changes and aVL compared to 05/25/2020 tracing.  Blood sugar normal at 93.  UA showed small hemoglobin otherwise was normal.  Patient is currently not experiencing any urinary symptoms will obtain culture but defer initiation of antibiotics.  Patient had significantly elevated blood pressure with orthostatic vital signs and was started on amlodipine.  She denies any chest pain, shortness of breath, lightheadedness, vision changes at the time of visit was instructed to seek immediate medical attention should she develop these symptoms in the future.  Recommended she rest and drink plenty of fluid.  Recommend she follow-up with both PCP and cardiology for additional management as we are limited in blood work that can be obtained and may need a Holter monitor.  CBC and CMP were obtained today-results pending.  Strict return precautions given to which patient expressed understanding.   Final Clinical Impressions(s) / UC Diagnoses   Final diagnoses:  Episodic lightheadedness  Palpitations  Near syncope  Essential hypertension     Discharge Instructions     We will be in touch with your lab results as soon as we have them.  I started you on amlodipine for blood pressure.  It is important that you follow-up with your PCP or someone else within 1 week to have this rechecked.  If you are not able to see another provider in this timeframe please return so we can reevaluate you.  Make sure that you are drinking plenty of fluid and eat small frequent meals.  Call cardiology and PCP for appointments as we discussed.  If you have any episodes of passing out  or additional symptoms including chest pain, shortness of breath, vision changes you need to go to the emergency room.    ED Prescriptions    Medication Sig Dispense Auth. Provider   amLODipine (NORVASC) 10 MG tablet Take 1 tablet (10 mg total) by mouth daily. 14 tablet Al Gagen, Noberto Retort, PA-C     PDMP not reviewed this encounter.   Jeani Hawking,  PA-C 03/25/21 1610

## 2021-03-26 LAB — URINE CULTURE: Culture: <10000 — AB

## 2021-04-05 ENCOUNTER — Ambulatory Visit: Payer: Self-pay | Admitting: Cardiology

## 2021-04-06 ENCOUNTER — Ambulatory Visit (INDEPENDENT_AMBULATORY_CARE_PROVIDER_SITE_OTHER): Payer: Self-pay

## 2021-04-06 ENCOUNTER — Ambulatory Visit (INDEPENDENT_AMBULATORY_CARE_PROVIDER_SITE_OTHER): Payer: Self-pay | Admitting: Interventional Cardiology

## 2021-04-06 ENCOUNTER — Encounter: Payer: Self-pay | Admitting: Interventional Cardiology

## 2021-04-06 ENCOUNTER — Other Ambulatory Visit: Payer: Self-pay

## 2021-04-06 VITALS — BP 106/76 | HR 82 | Ht 62.0 in | Wt 138.2 lb

## 2021-04-06 DIAGNOSIS — R002 Palpitations: Secondary | ICD-10-CM

## 2021-04-06 DIAGNOSIS — R0600 Dyspnea, unspecified: Secondary | ICD-10-CM

## 2021-04-06 DIAGNOSIS — R55 Syncope and collapse: Secondary | ICD-10-CM

## 2021-04-06 DIAGNOSIS — I10 Essential (primary) hypertension: Secondary | ICD-10-CM

## 2021-04-06 DIAGNOSIS — Z720 Tobacco use: Secondary | ICD-10-CM

## 2021-04-06 DIAGNOSIS — R0609 Other forms of dyspnea: Secondary | ICD-10-CM

## 2021-04-06 NOTE — Progress Notes (Unsigned)
Patient enrolled for [employer] to ship a 14 day ZIO XT long term holter monitor to her home. Patient supplied instructions and information regarding [employer]'s Patient Assistance Program at her appointment with Dr. Eldridge Dace.

## 2021-04-06 NOTE — Progress Notes (Signed)
Cardiology Office Note   Date:  04/06/2021   ID:  Jovonda Selner, DOB 15-Mar-1971, MRN [id number]  PCP:  [facility]    No chief complaint on file.  Palpitations/DOE  Wt Readings from Last 3 Encounters:  04/06/21 138 lb 3.2 oz (62.7 kg)  08/12/20 121 lb (54.9 kg)  06/25/20 117 lb 14.4 oz (53.5 kg)       History of Present Illness: [name] is a 50 y.o. female who is being seen today for the evaluation of palpitations at the request of [facility]  ER records show: "Patient presents today with a 90-month history of intermittent palpitations and near syncope.  Reports that symptoms have gradually been worsening and she had a brief syncopal episode a few days ago while at work.  She has not identified any triggers.  Denies any changes to diet, medications, recent head injury, recent illness.  Reports symptoms began after being released from the hospital in June/July 2021 following tubo-ovarian abscess.  She denies any history of thyroid condition, arrhythmia, cardiovascular disease.  She does drink alcohol but reports that this in moderation on the weekends.  She has a history of anemia and has not had any recent blood work; denies any abnormal bleeding including melena, hematochezia, menorrhagia.  She reports associated heat intolerance, palpitations, shortness of breath, nausea, lightheadedness with episodes.  She denies any increased stress or anxiety.  She is drinking plenty of fluid and eating small frequent meals throughout the day; denies any change to diet or eating habits."  She has felt some palpitations.  She has had DOE as well.      Past Medical History:  Diagnosis Date  . Abnormal Pap smear of cervix   . Abnormal uterine bleeding   . Adenomyosis   . Anemia   . Anxiety   . Bacterial vaginosis   . Dark stools   . Depression   . Elevated alkaline phosphatase measurement   . Episodic lightheadedness   . ETOH abuse   .  GERD (gastroesophageal reflux disease)   . Headache   . Hot flashes   . Hypertension   . Nausea   . Near syncope   . Palpitations   . Postmenopause bleeding   . Prediabetes   . Sepsis (HCC)   . SOB (shortness of breath)   . TOA (tubo-ovarian abscess) 05/28/2020    Past Surgical History:  Procedure Laterality Date  . abscess    . DENTAL SURGERY    . ENDOMETRIAL BIOPSY  08/12/2020      . IR RADIOLOGIST EVAL & MGMT  06/09/2020     Current Outpatient Medications  Medication Sig Dispense Refill  . amLODipine (NORVASC) 10 MG tablet Take 1 tablet (10 mg total) by mouth daily. 14 tablet 0  . metoprolol tartrate (LOPRESSOR) 25 MG tablet Take 25 mg by mouth 2 (two) times daily.     No current facility-administered medications for this visit.    Allergies:   Penicillins and Morphine and related    Social History:  The patient  reports that she has been smoking cigarettes. She has been smoking about 2.00 packs per day. She has never used smokeless tobacco. She reports current alcohol use. She reports that she does not use drugs.   Family History:  The patient's family history includes Hypertension in her father and mother.    ROS:  Please see the history of present illness.   Otherwise, review of systems are  positive for dizziness.   All other systems are reviewed and negative.    PHYSICAL EXAM: VS:  BP 106/76   Pulse 82   Ht 5\' 2"  (1.575 m)   Wt 138 lb 3.2 oz (62.7 kg)   LMP 04/11/2020 (Approximate)   SpO2 96%   BMI 25.28 kg/m  , BMI Body mass index is 25.28 kg/m. GEN: Well nourished, well developed, in no acute distress  HEENT: normal  Neck: no JVD, carotid bruits, or masses Cardiac: RRR; no murmurs, rubs, or gallops,no edema  Respiratory:  clear to auscultation bilaterally, normal work of breathing GI: soft, nontender, nondistended, + BS MS: no deformity or atrophy  Skin: warm and dry, no rash Neuro:  Strength and sensation are intact Psych: euthymic mood, full  affect   EKG:   The ekg ordered 4/28 demonstrates NSR, no ST changes   Recent Labs: 03/25/2021: ALT 23; BUN 11; Creatinine, Ser 0.77; Hemoglobin 13.9; Platelets 303; Potassium 4.0; Sodium 138   Lipid Panel No results found for: CHOL, TRIG, HDL, CHOLHDL, VLDL, LDLCALC, LDLDIRECT   Other studies Reviewed: Additional studies/ records that were reviewed today with results demonstrating: urgent care records reviewed.   ASSESSMENT AND PLAN:  1. DOE: Normal cardiac exam appears euvolemic.  If heart monitor is unremarkable, could consider echocardiogram.  I encouraged her to gradually try to increase her level of activity.  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  She has not been active in recent years.  Some of her shortness of breath may be deconditioning.  We will see if she has anything more than sinus tachycardia with activity. 2. Palpitations: Plan for 2 week Zio monitor.  Worse when she has a hot flash from menopause. Also associated with SHOB.  Metoprolol was prescribed. Will see hat rhythm does off of metoprolol.  She will stop the metoprolol.  She will need a thyroid check at some point.  3. HTN: The current medical regimen is effective;  continue present plan and medications.  Metoprolol was recently added.  4. Tobacco abuse: Not trying to quit.   5. Near syncope: Stay well-hydrated.  Continue to monitor symptoms.   Current medicines are reviewed at length with the patient today.  The patient concerns regarding her medicines were addressed.  The following changes have been made:  No change  Labs/ tests ordered today include: echo, Zio patch No orders of the defined types were placed in this encounter.   Recommend 150 minutes/week of aerobic exercise Low fat, low carb, high fiber diet recommended  Disposition:   FU based on test results   Signed, [employer], MD  04/06/2021 2:59 PM    [facility]  [facility] [address] [place], [place], [place]  [place] Phone: [phone]; Fax: [phone]

## 2021-04-06 NOTE — Patient Instructions (Addendum)
Medication Instructions:  Your physician recommends that you continue on your current medications as directed. Please refer to the Current Medication list given to you today.  *If you need a refill on your cardiac medications before your next appointment, please call your pharmacy*   Lab Work: none If you have labs (blood work) drawn today and your tests are completely normal, you will receive your results only by: Marland Kitchen MyChart Message (if you have MyChart) OR . A paper copy in the mail If you have any lab test that is abnormal or we need to change your treatment, we will call you to review the results.   Testing/Procedures: Your physician has recommended that you wear a holter monitor. Holter monitors are medical devices that record the heart's electrical activity. Doctors most often use these monitors to diagnose arrhythmias. Arrhythmias are problems with the speed or rhythm of the heartbeat. The monitor is a small, portable device. You can wear one while you do your normal daily activities. This is usually used to diagnose what is causing palpitations/syncope (passing out).  2 week zio monitor   Follow-Up: At [facility], you and your health needs are our priority.  As part of our continuing mission to provide you with exceptional heart care, we have created designated Provider Care Teams.  These Care Teams include your primary Cardiologist (physician) and Advanced Practice Providers (APPs -  Physician Assistants and Nurse Practitioners) who all work together to provide you with the care you need, when you need it.  We recommend signing up for the patient portal called "MyChart".  Sign up information is provided on this After Visit Summary.  MyChart is used to connect with patients for Virtual Visits (Telemedicine).  Patients are able to view lab/test results, encounter notes, upcoming appointments, etc.  Non-urgent messages can be sent to your provider as well.   To learn more about what  you can do with MyChart, go to [email or web address].    Your next appointment:   Based on monitor results  The format for your next appointment:   In Person  Provider:   You may see Lance Muss, MDor one of the following Advanced Practice Providers on your designated Care Team:    Ronie Spies, PA-C  Jacolyn Reedy, PA-C    Other Instructions  Christena Deem- Long Term Monitor Instructions   Your physician has requested you wear a ZIO patch monitor for _14__ days.  This is a single patch monitor.   [employer] supplies one patch monitor per enrollment. Additional stickers are not available. Please do not apply patch if you will be having a Nuclear Stress Test, Echocardiogram, Cardiac CT, MRI, or Chest Xray during the period you would be wearing the monitor. The patch cannot be worn during these tests. You cannot remove and re-apply the ZIO XT patch monitor.  Your ZIO patch monitor will be sent Fed Ex from [employer] directly to your home address. It may take 3-5 days to receive your monitor after you have been enrolled.  Once you have received your monitor, please review the enclosed instructions. Your monitor has already been registered assigning a specific monitor serial # to you.  Billing and Patient Assistance Program Information   We have supplied [employer] with any of your insurance information on file for billing purposes. [employer] offers a sliding scale Patient Assistance Program for patients that do not have insurance, or whose insurance does not completely cover the cost of the ZIO monitor.   You must apply  for the Patient Assistance Program to qualify for this discounted rate.     To apply, please call [employer] at [phone], select option 4, then select option 2, and ask to apply for Patient Assistance Program.  Meredeth Ide will ask your household income, and how many people are in your household.  They will quote your out-of-pocket cost based on that information.  [employer] will  also be able to set up a 34-month, interest-free payment plan if needed.  Applying the monitor   Shave hair from upper left chest.  Hold abrader disc by orange tab. Rub abrader in 40 strokes over the upper left chest as indicated in your monitor instructions.  Clean area with 4 enclosed alcohol pads. Let dry.  Apply patch as indicated in monitor instructions. Patch will be placed under collarbone on left side of chest with arrow pointing upward.  Rub patch adhesive wings for 2 minutes. Remove white label marked "1". Remove the white label marked "2". Rub patch adhesive wings for 2 additional minutes.  While looking in a mirror, press and release button in center of patch. A small green light will flash 3-4 times. This will be your only indicator that the monitor has been turned on. ?  Do not shower for the first 24 hours. You may shower after the first 24 hours.  Press the button if you feel a symptom. You will hear a small click. Record Date, Time and Symptom in the Patient Logbook.  When you are ready to remove the patch, follow instructions on the last 2 pages of the Patient Logbook. Stick patch monitor onto the last page of Patient Logbook.  Place Patient Logbook in the blue and white box.  Use locking tab on box and tape box closed securely.  The blue and white box has prepaid postage on it. Please place it in the mailbox as soon as possible. Your physician should have your test results approximately 7 days after the monitor has been mailed back to [facility].  Call [facility] Customer Care at [phone] if you have questions regarding your ZIO XT patch monitor. Call them immediately if you see an orange light blinking on your monitor.  If your monitor falls off in less than 4 days, contact our Monitor department at [phone]. ?If your monitor becomes loose or falls off after 4 days call [employer] at [phone] for suggestions on securing your monitor.?

## 2021-04-09 DIAGNOSIS — R002 Palpitations: Secondary | ICD-10-CM

## 2021-05-03 ENCOUNTER — Telehealth: Payer: Self-pay | Admitting: Interventional Cardiology

## 2021-05-03 NOTE — Telephone Encounter (Signed)
Patient inquiring about monitor results.  Monitor was mailed back on 5/27 and I informed her that the provider will release the results and we will call with the information.    Patient asking when that will be, advised hopefully by the end of the week.  Patient appreciative of call.

## 2021-05-03 NOTE — Telephone Encounter (Signed)
Follow Up:     Pt would like to know if Monitor results are ready please?i

## 2021-06-01 ENCOUNTER — Ambulatory Visit: Payer: Self-pay | Admitting: Interventional Cardiology

## 2021-09-30 ENCOUNTER — Telehealth: Payer: Self-pay | Admitting: Obstetrics and Gynecology

## 2021-09-30 NOTE — Telephone Encounter (Signed)
Patient said she was told to call back if she started bleeding again, she said Dr.Pickens told her that she should not bleed again and she want a call

## 2021-09-30 NOTE — Telephone Encounter (Signed)
I called [name] back and she informed me she had a nose bleed then about 2 weeks later started having some bleeding when she urinates sometimes and notices spotting on underwear sometimes. She also says she is having cramps like before . Per chart history PMB and History TOA abcess. I informed her she will need to see a provider and that I will have registrar contact her with appointment asap. I advised her our appointments are filled so it may be a few weeks, but we will try to get her in within next 2 weeks. I advised her to go to hospital if she develops a fever, or pain becomes severe. She voices understanding. Nyimah Shadduck,RN

## 2022-01-12 IMAGING — CT CT IMAGE GUIDED FLUID DRAIN BY CATHETER
1 of 3 series · 12 of 32 positions shown, 18 images · non-contrast
Comparison: none

INDICATION: 48-year-old female with a history of left-sided TOA referred for
drainage
TECHNIQUE: Informed written consent was obtained from the patient after a
thorough discussion of the procedural risks, benefits and
alternatives. All questions were addressed. Maximal Sterile Barrier
Technique was utilized including caps, mask, sterile gowns, sterile
gloves, sterile drape, hand hygiene and skin antiseptic. A timeout
was performed prior to the initiation of the procedure.

[Series 2: i-spiral 5.0 b40f · axial · 0.89mm/px · z∈[+812,+987]mm · 12 of 60 slices shown, 18 images]
[im 5/60  soft-tissue]
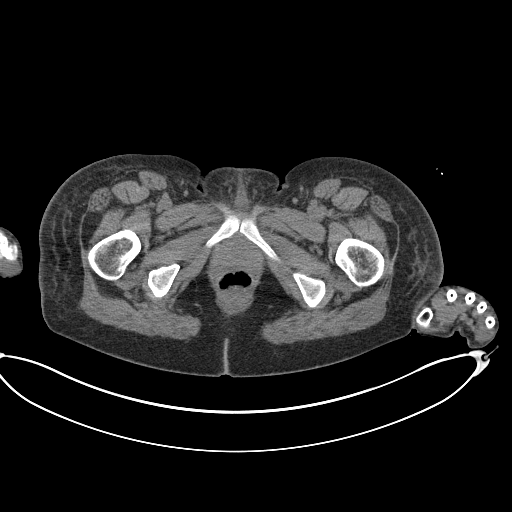
[im 5/60  bone]
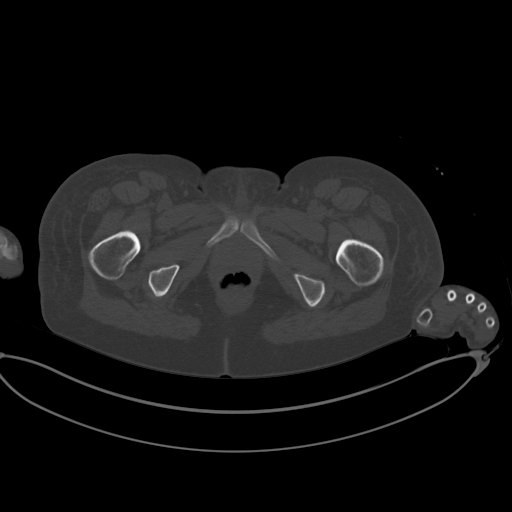
[im 9/60  soft-tissue]
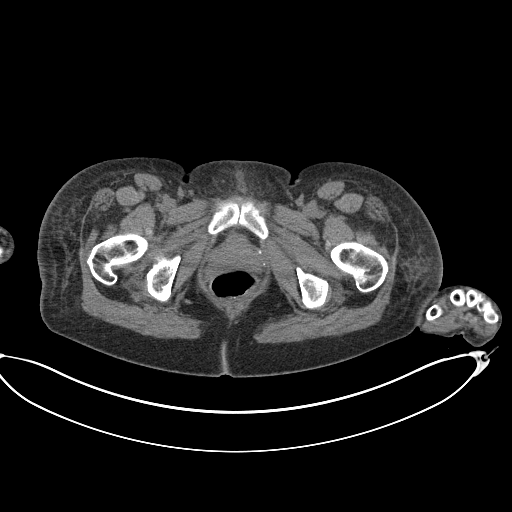
[im 13/60  soft-tissue]
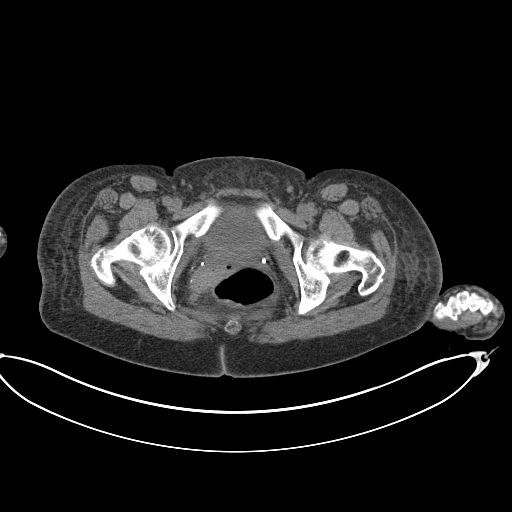
[im 17/60  soft-tissue]
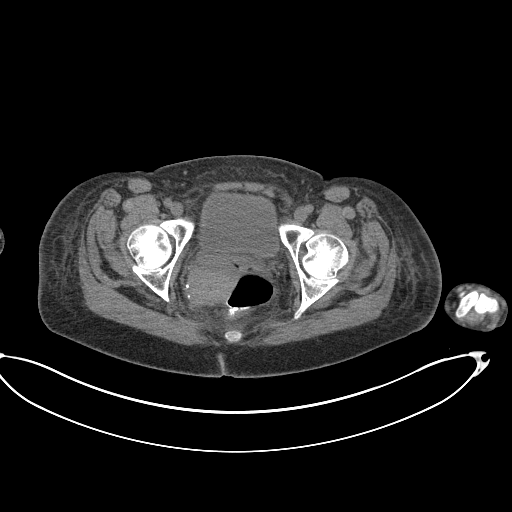
[im 22/60  soft-tissue]
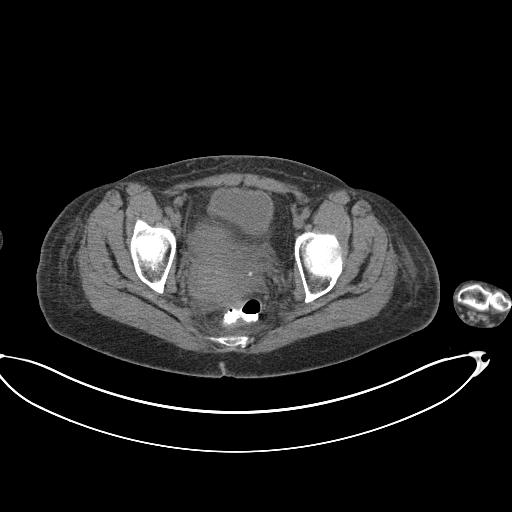
[im 26/60  soft-tissue]
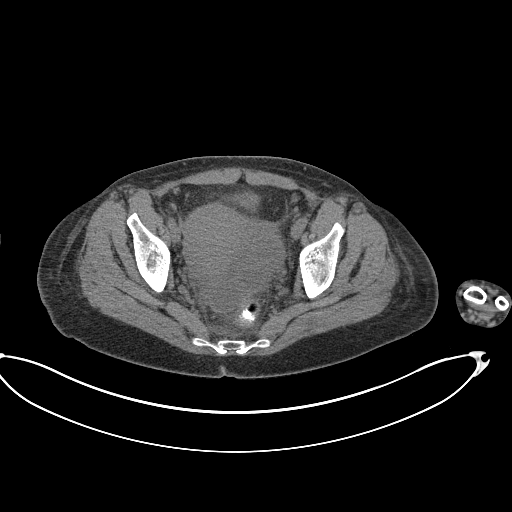
[im 34/60  soft-tissue]
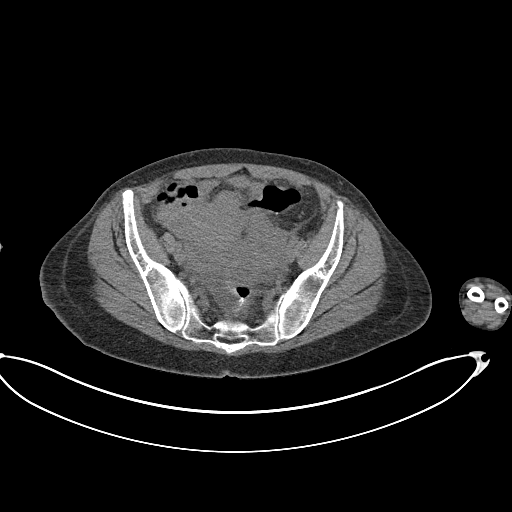
[im 38/60  soft-tissue]
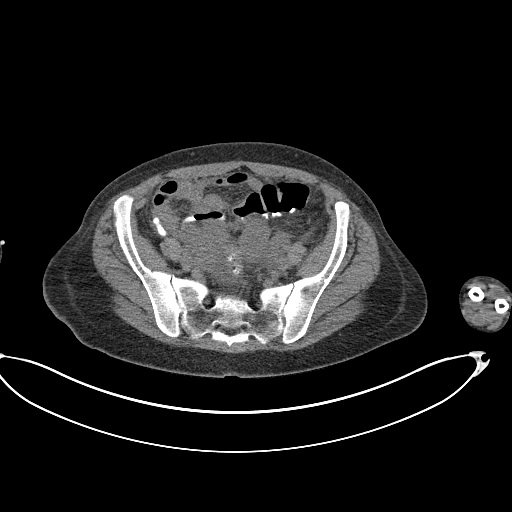
[im 43/60  soft-tissue]
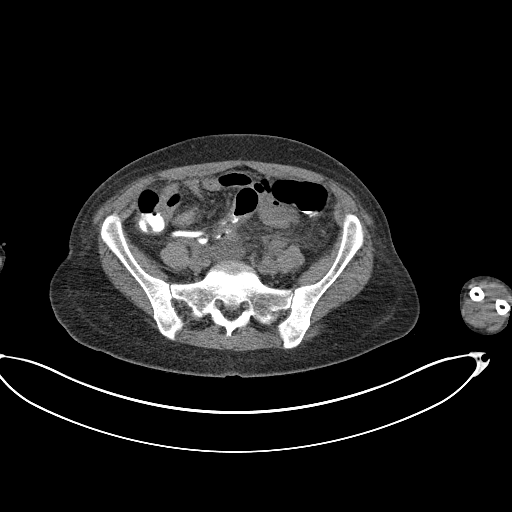
[im 43/60  lung]
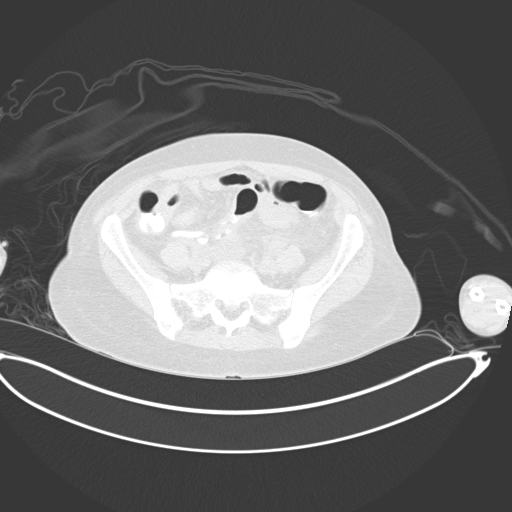
[im 43/60  bone]
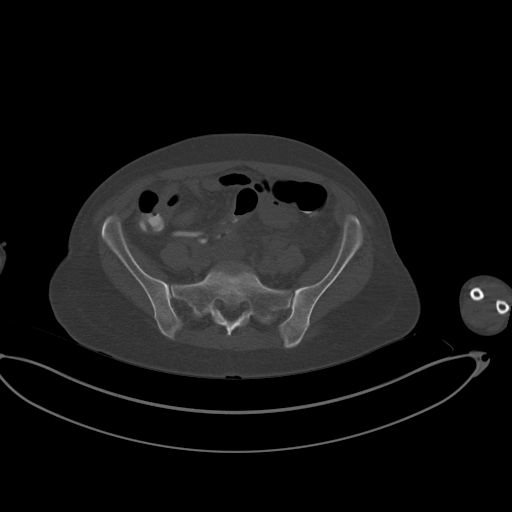
[im 47/60  soft-tissue]
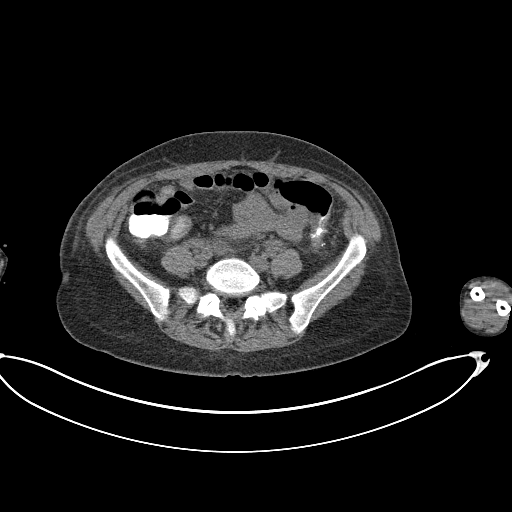
[im 47/60  lung]
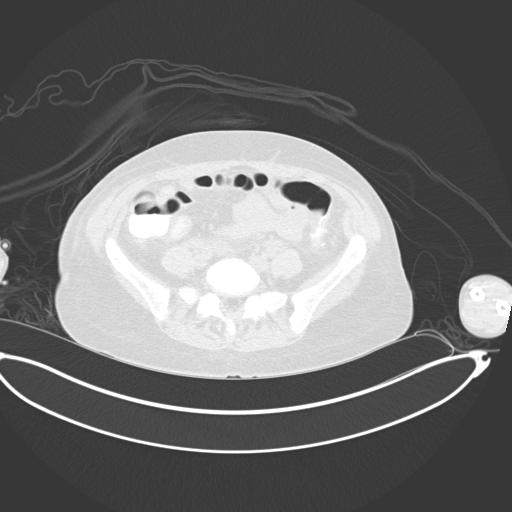
[im 51/60  soft-tissue]
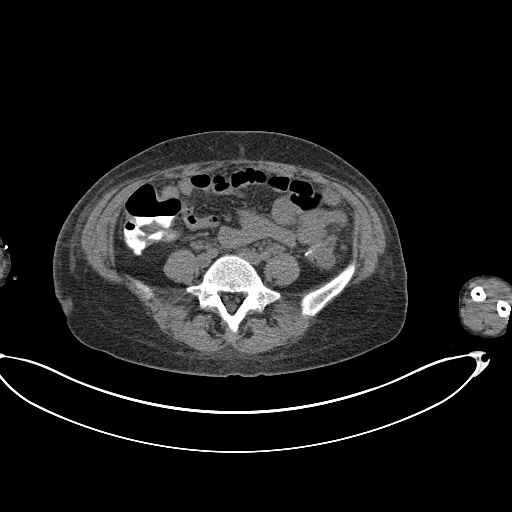
[im 51/60  lung]
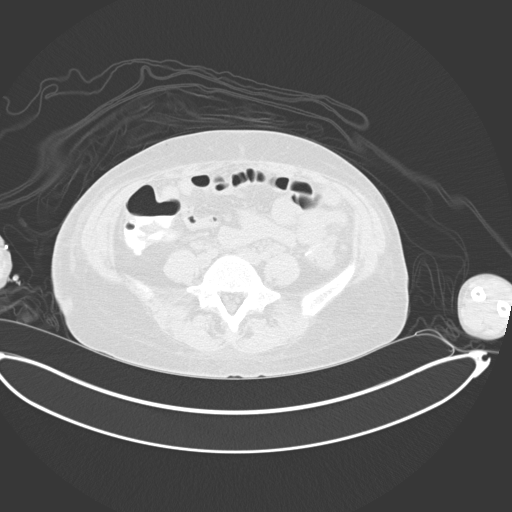
[im 55/60  soft-tissue]
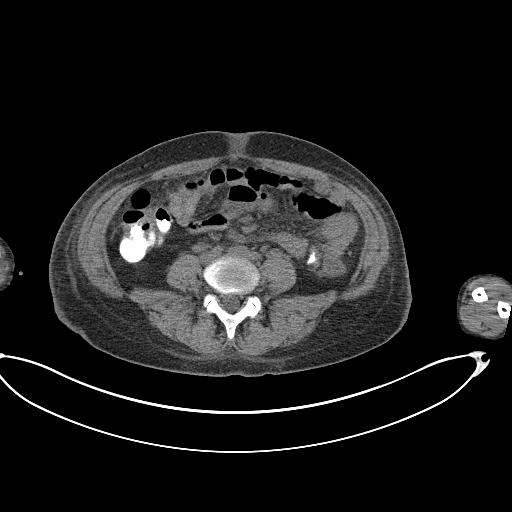
[im 55/60  lung]
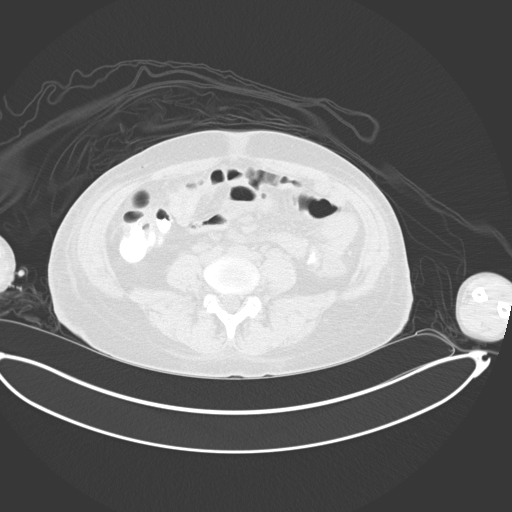

[12 of 32 positions shown; findings below may reference images not displayed]

EXAM:
CT GUIDED DRAINAGE OF TUBO-OVARIAN ABSCESS

MEDICATIONS:
The patient is currently admitted to the hospital and receiving
intravenous antibiotics. The antibiotics were administered within an
appropriate time frame prior to the initiation of the procedure.

ANESTHESIA/SEDATION:
2.0 mg IV Versed 100 mcg IV Fentanyl

Moderate Sedation Time:  19 minutes

The patient was continuously monitored during the procedure by the
interventional radiology nurse under my direct supervision.

COMPLICATIONS:
None
PROCEDURE:
The left lower quadrant was prepped with chlorhexidine in a sterile
fashion, and a sterile drape was applied covering the operative
field. A sterile gown and sterile gloves were used for the
procedure. Local anesthesia was provided with 1% Lidocaine.

Once the patient is prepped and draped in the usual sterile fashion
1% lidocaine was used for local anesthesia. Using CT guidance, we
advanced an 18 gauge trocar needle into the abscess of the left
pelvis. Once we confirmed needle tip position, stylet was removed
and we aspirated small amount of pus to confirm location. Modified
Seldinger technique was used to place a 12 French drain. Upon
placement of 12 French drain, no additional purulent material was
aspirated.

The drain was withdrawn and a second needle position was used for
drain placement.

CT was again used with the same incision site to place a needle into
an alternative site within the abscess.

Modified Seldinger technique was again used to place a 12 French
drain into the abscess.

With this drain placement, approximately 30 cc of purulent material
aspirated. We did not completely form the pigtail catheter given the
patient's exquisite tenderness with manipulation.

Catheter was sutured in position and attached to bulb suction. Final
image was stored.

Patient tolerated the procedure well and remained hemodynamically
stable throughout.

No complications were encountered and no significant blood loss.
FINDINGS: CT demonstrates ill-defined mass in the left lower quadrant
corresponding to the patient's known TOA.

Final drain position demonstrates the drain located within the
left-sided TOA. Approximately 30 cc of purulent material was
aspirated.
IMPRESSION: Status post CT-guided drainage of left tubo-ovarian abscess.

## 2022-11-11 ENCOUNTER — Ambulatory Visit: Payer: Self-pay | Admitting: *Deleted

## 2022-11-11 NOTE — Telephone Encounter (Signed)
Reason for Disposition  [1] Age > 50 years  AND [2] now alert and feels fine  Answer Assessment - Initial Assessment Questions 1. ONSET: "How long were you unconscious?" (minutes) "When did it happen?"     Today-"passed out" - not sure how low- at work 2. CONTENT: "What happened during period of unconsciousness?" (e.g., seizure activity)      Went back- sitting in chair 3. MENTAL STATUS: "Alert and oriented now?" (oriented x 3 = name, month, location)      Alert and oriented 4. 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 RECURRENT SYMPTOM: "Have you ever passed out before?" If Yes, ask: "When was the last time?" and "What happened that time?"      Yes- low hgb- years 6. INJURY: "Did you sustain any injury during the fall?"      No 7. CARDIAC SYMPTOMS: "Have you had any of the following symptoms: chest pain, difficulty breathing, palpitations?"     no  Protocols used: Fainting-A-AH

## 2022-11-11 NOTE — Telephone Encounter (Signed)
  Chief Complaint: fainted Symptoms: body ache, headache Frequency: today Pertinent Negatives: Patient denies chest pain, difficulty breathing, palpitations Disposition: [x] ED /[] Urgent Care (no appt availability in office) / [] Appointment(In office/virtual)/ []  [facility]/ [] Home Care/ [] Refused Recommended Disposition /[] [facility]/ []  Follow-up with PCP Additional Notes: No PCP- advised ED for evaluation

## 2023-01-28 ENCOUNTER — Ambulatory Visit (HOSPITAL_COMMUNITY)
Admission: EM | Admit: 2023-01-28 | Discharge: 2023-01-28 | Disposition: A | Discharge status: Other Acute Inpatient Hospital | Payer: No Payment, Other | Attending: Psychiatry | Admitting: Psychiatry

## 2023-01-28 ENCOUNTER — Emergency Department (HOSPITAL_COMMUNITY)
Admission: EM | Admit: 2023-01-28 | Discharge: 2023-01-29 | Disposition: A | Discharge status: Home / Self Care | Payer: Self-pay | Attending: Emergency Medicine | Admitting: Emergency Medicine

## 2023-01-28 ENCOUNTER — Encounter (HOSPITAL_COMMUNITY): Payer: Self-pay | Admitting: *Deleted

## 2023-01-28 ENCOUNTER — Other Ambulatory Visit: Payer: Self-pay

## 2023-01-28 DIAGNOSIS — F419 Anxiety disorder, unspecified: Secondary | ICD-10-CM | POA: Insufficient documentation

## 2023-01-28 DIAGNOSIS — H1033 Unspecified acute conjunctivitis, bilateral: Secondary | ICD-10-CM | POA: Insufficient documentation

## 2023-01-28 DIAGNOSIS — R45851 Suicidal ideations: Secondary | ICD-10-CM | POA: Insufficient documentation

## 2023-01-28 DIAGNOSIS — F329 Major depressive disorder, single episode, unspecified: Secondary | ICD-10-CM | POA: Insufficient documentation

## 2023-01-28 DIAGNOSIS — F10920 Alcohol use, unspecified with intoxication, uncomplicated: Secondary | ICD-10-CM

## 2023-01-28 DIAGNOSIS — Z79899 Other long term (current) drug therapy: Secondary | ICD-10-CM | POA: Insufficient documentation

## 2023-01-28 DIAGNOSIS — F332 Major depressive disorder, recurrent severe without psychotic features: Secondary | ICD-10-CM

## 2023-01-28 DIAGNOSIS — F1010 Alcohol abuse, uncomplicated: Secondary | ICD-10-CM

## 2023-01-28 DIAGNOSIS — Z566 Other physical and mental strain related to work: Secondary | ICD-10-CM | POA: Diagnosis not present

## 2023-01-28 DIAGNOSIS — Z1152 Encounter for screening for COVID-19: Secondary | ICD-10-CM | POA: Insufficient documentation

## 2023-01-28 DIAGNOSIS — F430 Acute stress reaction: Secondary | ICD-10-CM

## 2023-01-28 DIAGNOSIS — I10 Essential (primary) hypertension: Secondary | ICD-10-CM | POA: Insufficient documentation

## 2023-01-28 DIAGNOSIS — F321 Major depressive disorder, single episode, moderate: Secondary | ICD-10-CM

## 2023-01-28 LAB — COMPREHENSIVE METABOLIC PANEL
ALT: 24 U/L (ref 0–44)
AST: 27 U/L (ref 15–41)
Albumin: 4.3 g/dL (ref 3.5–5.0)
Alkaline Phosphatase: 75 U/L (ref 38–126)
Anion gap: 15 (ref 5–15)
BUN: 7 mg/dL (ref 6–20)
CO2: 23 mmol/L (ref 22–32)
Calcium: 9.6 mg/dL (ref 8.9–10.3)
Chloride: 100 mmol/L (ref 98–111)
Creatinine, Ser: 0.81 mg/dL (ref 0.44–1.00)
GFR, Estimated: >60 mL/min (ref >60)
Glucose, Bld: 86 mg/dL (ref 70–99)
Potassium: 4.1 mmol/L (ref 3.5–5.1)
Sodium: 138 mmol/L (ref 135–145)
Total Bilirubin: 0.4 mg/dL (ref 0.3–1.2)
Total Protein: 7.6 g/dL (ref 6.5–8.1)

## 2023-01-28 LAB — RAPID URINE DRUG SCREEN, HOSP PERFORMED
Amphetamines: NOT DETECTED
Barbiturates: NOT DETECTED
Benzodiazepines: NOT DETECTED
Cocaine: NOT DETECTED
Opiates: NOT DETECTED
Tetrahydrocannabinol: POSITIVE — AB

## 2023-01-28 LAB — SALICYLATE LEVEL: Salicylate Lvl: <7 mg/dL — ABNORMAL LOW (ref 7.0–30.0)

## 2023-01-28 LAB — CBC
HCT: 47.2 % — ABNORMAL HIGH (ref 36.0–46.0)
Hemoglobin: 15.4 g/dL — ABNORMAL HIGH (ref 12.0–15.0)
MCH: 31.5 pg (ref 26.0–34.0)
MCHC: 32.6 g/dL (ref 30.0–36.0)
MCV: 96.5 fL (ref 80.0–100.0)
Platelets: 307 10*3/uL (ref 150–400)
RBC: 4.89 MIL/uL (ref 3.87–5.11)
RDW: 14.2 % (ref 11.5–15.5)
WBC: 6.4 10*3/uL (ref 4.0–10.5)
nRBC: 0 % (ref 0.0–0.2)

## 2023-01-28 LAB — ACETAMINOPHEN LEVEL: Acetaminophen (Tylenol), Serum: <10 ug/mL — ABNORMAL LOW (ref 10–30)

## 2023-01-28 LAB — ETHANOL: Alcohol, Ethyl (B): 113 mg/dL — ABNORMAL HIGH (ref <10)

## 2023-01-28 MED ORDER — MAGNESIUM HYDROXIDE 400 MG/5ML PO SUSP: 30.0000 mL | Freq: Every day | ORAL | Status: DC | PRN

## 2023-01-28 MED ORDER — LORAZEPAM 1 MG PO TABS: 1.0000 mg | ORAL_TABLET | ORAL | Status: DC | PRN

## 2023-01-28 MED ORDER — LORAZEPAM 1 MG PO TABS: 1.0000 mg | ORAL_TABLET | Freq: Four times a day (QID) | ORAL | Status: DC | PRN

## 2023-01-28 MED ORDER — ACETAMINOPHEN 325 MG PO TABS: 650.0000 mg | ORAL_TABLET | Freq: Four times a day (QID) | ORAL | Status: DC | PRN

## 2023-01-28 MED ORDER — LOPERAMIDE HCL 2 MG PO CAPS: 2.0000 mg | ORAL_CAPSULE | ORAL | Status: DC | PRN

## 2023-01-28 MED ORDER — ONDANSETRON 4 MG PO TBDP: 4.0000 mg | ORAL_TABLET | Freq: Four times a day (QID) | ORAL | Status: DC | PRN

## 2023-01-28 MED ORDER — THIAMINE HCL 100 MG/ML IJ SOLN: 100.0000 mg | Freq: Once | INTRAMUSCULAR | Status: DC

## 2023-01-28 MED ORDER — ALUM & MAG HYDROXIDE-SIMETH 200-200-20 MG/5ML PO SUSP: 30.0000 mL | ORAL | Status: DC | PRN

## 2023-01-28 MED ORDER — TRAZODONE HCL 50 MG PO TABS: 50.0000 mg | ORAL_TABLET | Freq: Every evening | ORAL | Status: DC | PRN

## 2023-01-28 MED ORDER — ADULT MULTIVITAMIN W/MINERALS CH: 1.0000 | ORAL_TABLET | Freq: Every day | ORAL | Status: DC

## 2023-01-28 MED ORDER — THIAMINE MONONITRATE 100 MG PO TABS: 100.0000 mg | ORAL_TABLET | Freq: Every day | ORAL | Status: DC

## 2023-01-28 MED ORDER — HYDROXYZINE HCL 25 MG PO TABS: 25.0000 mg | ORAL_TABLET | Freq: Four times a day (QID) | ORAL | Status: DC | PRN

## 2023-01-28 MED ORDER — METOPROLOL TARTRATE 25 MG PO TABS
25.0000 mg | ORAL_TABLET | Freq: Two times a day (BID) | ORAL | Status: DC
  Administered 2023-01-28 – 2023-01-29 (×2): 25 mg via ORAL
  Filled 2023-01-28 (×2): qty 1

## 2023-01-28 MED ORDER — ZIPRASIDONE MESYLATE 20 MG IM SOLR: 20.0000 mg | INTRAMUSCULAR | Status: DC | PRN

## 2023-01-28 MED ORDER — AMLODIPINE BESYLATE 5 MG PO TABS
10.0000 mg | ORAL_TABLET | Freq: Every day | ORAL | Status: DC
  Administered 2023-01-28 – 2023-01-29 (×2): 10 mg via ORAL
  Filled 2023-01-28 (×2): qty 2

## 2023-01-28 MED ORDER — OLANZAPINE 5 MG PO TBDP: 5.0000 mg | ORAL_TABLET | Freq: Three times a day (TID) | ORAL | Status: DC | PRN
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
## 2023-01-28 NOTE — Discharge Instructions (Addendum)
Transfer to [facility]
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
## 2023-01-28 NOTE — Progress Notes (Signed)
   01/28/23 1336  [place] (Walk-ins at [facility] only)  How Did You Hear About [place]? Family/Friend  What Is the Reason for Your Visit/Call Today? Alcohol Addiction, Depression; 52 year old presents to [facility] voluntarily , via [facility] and accompanied by her husband, Jenafer Donnelly, with complaints of depression, and treatment for addiction.  Pt reports her father died one month ago, "I cannot  get over it, I have been crying and drinking 2, 40oz of beer daily.  Pt also reports smoking a blunt (marijuana) daily.  Pt deneis SI, HI, or AVH.  Pt husband reports "she says daily, I want to be with my father". Pt admits to prior diagnosis or precribed medication for symptom managment.  How Long Has This Been Causing You Problems? 1-6 months  Have You Recently Had Any Thoughts About Hurting Yourself? Yes (Pt's husband reports "she say I want to be with my her dead father daily".)  How long ago did you have thoughts about hurting yourself? week  Are You Planning to [place] At This time? No  Have you Recently Had Thoughts About [place]? No  Are You Planning To Harm Someone At This Time? No  Are you currently experiencing any auditory, visual or other hallucinations? No  Have You Used Any Alcohol or Drugs in the Past 24 Hours? Yes  How long ago did you use Drugs or Alcohol? 2, 40oz beer daily, 1 blunt  What Did You Use and How Much? alcohol, marijuana  Do you have any current medical co-morbidities that require immediate attention? No  Clinician description of patient physical appearance/behavior: Tearful, Sad, dishelved  What Do You Feel Would Help You the Most Today? Alcohol or Drug Use Treatment;Treatment for Depression or other mood problem  If access to [facility] was not available, would you have sought care in the Emergency Department? Yes  Determination of Need Urgent (48 hours)  Options For Referral [facility]

## 2023-01-28 NOTE — ED Notes (Signed)
Patient provided purple scrubs, patient refused to put on purple scrubs stating "I never wear yall's clothes, I am not taking my clothes off and putting those on." Triage RN notified.

## 2023-01-28 NOTE — ED Triage Notes (Signed)
The pt came from [facility]  she has been ivcd by her husband she is calm relaxed  she reports that she does not know why she's here.  She reports that her father just dies and she has been sad about the death

## 2023-01-28 NOTE — ED Triage Notes (Signed)
The pt denies si or hi  her husband ivcd her she does not think she needs to be here  no previous history   laughing talking with the gpd that brought her in here today  she's refusing to get undressed
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
## 2023-01-29 ENCOUNTER — Encounter (HOSPITAL_COMMUNITY): Payer: Self-pay | Admitting: Psychiatry

## 2023-01-29 ENCOUNTER — Inpatient Hospital Stay (HOSPITAL_COMMUNITY)
Admission: AD | Admit: 2023-01-29 | Discharge: 2023-02-02 | DRG: 882 | Disposition: A | Discharge status: Home / Self Care | Payer: No Typology Code available for payment source | Source: Intra-hospital | Attending: Psychiatry | Admitting: Psychiatry

## 2023-01-29 DIAGNOSIS — Z23 Encounter for immunization: Secondary | ICD-10-CM

## 2023-01-29 DIAGNOSIS — F1090 Alcohol use, unspecified, uncomplicated: Secondary | ICD-10-CM | POA: Diagnosis present

## 2023-01-29 DIAGNOSIS — R45850 Homicidal ideations: Secondary | ICD-10-CM | POA: Diagnosis present

## 2023-01-29 DIAGNOSIS — F1220 Cannabis dependence, uncomplicated: Secondary | ICD-10-CM | POA: Diagnosis present

## 2023-01-29 DIAGNOSIS — F4323 Adjustment disorder with mixed anxiety and depressed mood: Secondary | ICD-10-CM | POA: Diagnosis not present

## 2023-01-29 DIAGNOSIS — R45851 Suicidal ideations: Secondary | ICD-10-CM | POA: Diagnosis present

## 2023-01-29 DIAGNOSIS — F411 Generalized anxiety disorder: Secondary | ICD-10-CM | POA: Diagnosis present

## 2023-01-29 DIAGNOSIS — I10 Essential (primary) hypertension: Secondary | ICD-10-CM | POA: Diagnosis present

## 2023-01-29 DIAGNOSIS — F17210 Nicotine dependence, cigarettes, uncomplicated: Secondary | ICD-10-CM | POA: Diagnosis present

## 2023-01-29 DIAGNOSIS — F1020 Alcohol dependence, uncomplicated: Secondary | ICD-10-CM | POA: Diagnosis present

## 2023-01-29 DIAGNOSIS — G4700 Insomnia, unspecified: Secondary | ICD-10-CM | POA: Diagnosis present

## 2023-01-29 DIAGNOSIS — F17200 Nicotine dependence, unspecified, uncomplicated: Secondary | ICD-10-CM | POA: Diagnosis present

## 2023-01-29 DIAGNOSIS — Z79899 Other long term (current) drug therapy: Secondary | ICD-10-CM | POA: Diagnosis not present

## 2023-01-29 DIAGNOSIS — Z20822 Contact with and (suspected) exposure to covid-19: Secondary | ICD-10-CM | POA: Diagnosis present

## 2023-01-29 DIAGNOSIS — F329 Major depressive disorder, single episode, unspecified: Secondary | ICD-10-CM | POA: Diagnosis present

## 2023-01-29 DIAGNOSIS — Y905 Blood alcohol level of 100-119 mg/100 ml: Secondary | ICD-10-CM | POA: Diagnosis present

## 2023-01-29 DIAGNOSIS — K5900 Constipation, unspecified: Secondary | ICD-10-CM | POA: Diagnosis present

## 2023-01-29 LAB — RESP PANEL BY RT-PCR (RSV, FLU A&B, COVID)  RVPGX2
Influenza A by PCR: NEGATIVE
Influenza B by PCR: NEGATIVE
Resp Syncytial Virus by PCR: NEGATIVE
SARS Coronavirus 2 by RT PCR: NEGATIVE

## 2023-01-29 MED ORDER — TRAZODONE HCL 50 MG PO TABS: 50.0000 mg | ORAL_TABLET | Freq: Every evening | ORAL | Status: DC | PRN

## 2023-01-29 MED ORDER — LORAZEPAM 2 MG/ML IJ SOLN: 2.0000 mg | Freq: Three times a day (TID) | INTRAMUSCULAR | Status: DC | PRN

## 2023-01-29 MED ORDER — AMLODIPINE BESYLATE 10 MG PO TABS
10.0000 mg | ORAL_TABLET | Freq: Every day | ORAL | Status: DC
  Administered 2023-01-30 – 2023-02-02 (×4): 10 mg via ORAL
  Filled 2023-01-29: qty 1
  Filled 2023-01-29: qty 7
  Filled 2023-01-29 (×6): qty 1

## 2023-01-29 MED ORDER — DIPHENHYDRAMINE HCL 50 MG/ML IJ SOLN: 50.0000 mg | Freq: Three times a day (TID) | INTRAMUSCULAR | Status: DC | PRN

## 2023-01-29 MED ORDER — MAGNESIUM HYDROXIDE 400 MG/5ML PO SUSP: 30.0000 mL | Freq: Every day | ORAL | Status: DC | PRN

## 2023-01-29 MED ORDER — HALOPERIDOL 5 MG PO TABS
5.0000 mg | ORAL_TABLET | Freq: Three times a day (TID) | ORAL | Status: DC | PRN
  Administered 2023-01-30: 5 mg via ORAL
  Filled 2023-01-29: qty 1

## 2023-01-29 MED ORDER — HYDROXYZINE HCL 25 MG PO TABS
25.0000 mg | ORAL_TABLET | Freq: Three times a day (TID) | ORAL | Status: DC | PRN
  Administered 2023-01-30: 25 mg via ORAL
  Filled 2023-01-29: qty 1

## 2023-01-29 MED ORDER — DIPHENHYDRAMINE HCL 25 MG PO CAPS: 50.0000 mg | ORAL_CAPSULE | Freq: Three times a day (TID) | ORAL | Status: DC | PRN

## 2023-01-29 MED ORDER — HALOPERIDOL LACTATE 5 MG/ML IJ SOLN: 5.0000 mg | Freq: Three times a day (TID) | INTRAMUSCULAR | Status: DC | PRN

## 2023-01-29 MED ORDER — METOPROLOL TARTRATE 25 MG PO TABS
25.0000 mg | ORAL_TABLET | Freq: Two times a day (BID) | ORAL | Status: DC
  Administered 2023-01-29 – 2023-01-31 (×5): 25 mg via ORAL
  Filled 2023-01-29 (×10): qty 1

## 2023-01-29 MED ORDER — LORAZEPAM 1 MG PO TABS: 2.0000 mg | ORAL_TABLET | Freq: Three times a day (TID) | ORAL | Status: DC | PRN

## 2023-01-29 MED ORDER — ALUM & MAG HYDROXIDE-SIMETH 200-200-20 MG/5ML PO SUSP
30.0000 mL | ORAL | Status: DC | PRN
  Administered 2023-01-30: 30 mL via ORAL
  Filled 2023-01-29: qty 30

## 2023-01-29 MED ORDER — ACETAMINOPHEN 325 MG PO TABS: 650.0000 mg | ORAL_TABLET | Freq: Four times a day (QID) | ORAL | Status: DC | PRN

## 2023-01-29 NOTE — ED Notes (Signed)
Pt showering, Pt calm and cooperative at this time.

## 2023-01-29 NOTE — Progress Notes (Signed)
BHH/BMU LCSW Progress Note   01/29/2023    2:29 PM  Tykerria Seanor   [id number]   Type of Contact and Topic:  Psychiatric Bed Placement   Pt accepted to [facility] 405-1   Patient meets inpatient criteria per Vesta Mixer, NP   The attending provider will be Dr. Caswell Corwin  Call report to [phone]    Nichola Sizer, RN @ [facility] ED notified.     Pt scheduled  to arrive at [place].    Mariea Clonts, MSW, LCSW-A  2:30 PM 01/29/2023

## 2023-01-29 NOTE — ED Provider Notes (Addendum)
Emergency Medicine Observation Re-evaluation Note  [name] is a 52 y.o. female, seen on rounds today.  Pt initially presented to the ED for complaints of depression with SI, and alcohol use disorder. Pt currently calm, nad.   Physical Exam  BP (!) 148/99 (BP Location: Right Arm)   Pulse 76   Temp 98.1 F (36.7 C) (Oral)   Resp 16   Ht 1.575 m ('5\' 2"'$ )   Wt 62.7 kg   LMP 04/11/2020 (Approximate)   SpO2 100%   BMI 25.28 kg/m  Physical Exam General: calm, no distress. Cardiac: regular rate.  Lungs: breathing comfortably. Psych: pt currently smiling, interactive w staff. Pt denies thoughts of self harm or any plan/intent to harm self.  She does acknowledge drinking etoh yesterday and reminiscing and feeling sad as relates the passing of her father. Also acknowledges comment to effect of 'wanting to be with her father'.  Pt does not appear to be responding to internal stimuli - no delusions or hallucinations noted.   ED Course / MDM    I have reviewed the labs performed to date as well as medications administered while in observation.  Recent changes in the last 24 hours include ED obs, reassessment.   Plan  On chart review - it is not clear why patient sent from [facility] to ED, and [place] appears the most appropriate location for patient given the nature of her complaints.  [place] team is recommending inpatient tx (although pt not currently noted to be on any BH meds).  Will ask [place] team to reassess, including update on plan for meds (and ?return to [facility] or [facility] bed).      Lajean Saver, MD 01/29/23 1059     [place] team has reassessed and is recommending inpatient BH. Indicates accepted to [facility], Dr Caswell Corwin.   Pt currently appears stable for transfer/transport.       Lajean Saver, MD 01/29/23 [phone]

## 2023-01-29 NOTE — ED Notes (Signed)
Patient states she does not have any thoughts of hurting herself or others, states she is grieving over her recently deceased father, states that she has 5 therapy sessions scheduled through her employer to help with her grief and increase her coping skills.

## 2023-01-29 NOTE — ED Notes (Signed)
Patient awake and alert this morning, no s/s of distress, AOX4, ambulatory, sitter present at the door, will continue to monitor.

## 2023-01-29 NOTE — ED Notes (Addendum)
Report given to [facility] RN, awaiting transport to [facility].

## 2023-01-29 NOTE — Consult Note (Signed)
  Per LCSW Type of Contact and Topic:  Psychiatric Bed Placement    Pt accepted to [facility] 405-1    Patient meets inpatient criteria per Vesta Mixer, NP    The attending provider will be Dr. Caswell Corwin   Call report to [phone]     Nichola Sizer, RN @ [facility] ED notified.      Pt scheduled  to arrive at [place].  Pt under IVC. Attempted to call her husband x3 but no response.

## 2023-01-29 NOTE — ED Notes (Signed)
Patient transported to [facility]

## 2023-01-29 NOTE — ED Notes (Signed)
Patient back to room.

## 2023-01-29 NOTE — ED Notes (Signed)
Patients husband [name] would like to be called by psychiatry at [phone] for an update on her plan of her care.

## 2023-01-29 NOTE — ED Notes (Signed)
Patient ambulatory to rr with sitter

## 2023-01-29 NOTE — ED Notes (Signed)
Patient resting quietly in bed with sitter at bedside

## 2023-01-29 NOTE — ED Notes (Signed)
Patient ambulatory to rr with sitter and back to room

## 2023-01-30 ENCOUNTER — Other Ambulatory Visit: Payer: Self-pay

## 2023-01-30 ENCOUNTER — Encounter (HOSPITAL_COMMUNITY): Payer: Self-pay

## 2023-01-30 DIAGNOSIS — F17200 Nicotine dependence, unspecified, uncomplicated: Secondary | ICD-10-CM | POA: Diagnosis present

## 2023-01-30 DIAGNOSIS — F4323 Adjustment disorder with mixed anxiety and depressed mood: Principal | ICD-10-CM | POA: Diagnosis present

## 2023-01-30 DIAGNOSIS — G4700 Insomnia, unspecified: Secondary | ICD-10-CM | POA: Diagnosis present

## 2023-01-30 DIAGNOSIS — F1220 Cannabis dependence, uncomplicated: Secondary | ICD-10-CM | POA: Diagnosis present

## 2023-01-30 DIAGNOSIS — F1090 Alcohol use, unspecified, uncomplicated: Secondary | ICD-10-CM | POA: Diagnosis present

## 2023-01-30 MED ORDER — NALTREXONE HCL 50 MG PO TABS
50.0000 mg | ORAL_TABLET | Freq: Every day | ORAL | Status: DC
  Administered 2023-02-01 – 2023-02-02 (×2): 50 mg via ORAL
  Filled 2023-01-30: qty 7
  Filled 2023-01-30 (×3): qty 1

## 2023-01-30 MED ORDER — ESCITALOPRAM OXALATE 10 MG PO TABS
10.0000 mg | ORAL_TABLET | Freq: Every day | ORAL | Status: DC
  Administered 2023-01-31 – 2023-02-02 (×3): 10 mg via ORAL
  Filled 2023-01-30 (×5): qty 1
  Filled 2023-01-30: qty 7

## 2023-01-30 MED ORDER — NALTREXONE HCL 50 MG PO TABS
25.0000 mg | ORAL_TABLET | Freq: Every day | ORAL | Status: AC
  Administered 2023-01-31: 25 mg via ORAL
  Filled 2023-01-30: qty 1

## 2023-01-30 MED ORDER — INFLUENZA VAC SPLIT QUAD 0.5 ML IM SUSY
0.5000 mL | PREFILLED_SYRINGE | INTRAMUSCULAR | Status: AC
  Administered 2023-02-01: 0.5 mL via INTRAMUSCULAR
  Filled 2023-01-30: qty 0.5

## 2023-01-30 NOTE — Tx Team (Signed)
Initial Treatment Plan 01/30/2023 1:08 AM [name] [id number]    PATIENT STRESSORS: Loss of Dad    Traumatic event     PATIENT STRENGTHS: Capable of independent living  Supportive family/friends    PATIENT IDENTIFIED PROBLEMS: Loss of family member  Anxiety                   DISCHARGE CRITERIA:  Ability to meet basic life and health needs Improved stabilization in mood, thinking, and/or behavior  PRELIMINARY DISCHARGE PLAN: Return to previous living arrangement  PATIENT/FAMILY INVOLVEMENT: This treatment plan has been presented to and reviewed with the patient, Ariebella Reo, and/or family member, .  The patient and family have been given the opportunity to ask questions and make suggestions.  Irving Shows, RN 01/30/2023, 1:08 AM
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
## 2023-01-30 NOTE — Progress Notes (Signed)
   01/30/23 0710  Psych Admission Type (Psych Patients Only)  Admission Status Involuntary  Psychosocial Assessment  Patient Complaints Agitation  Eye Contact Intense  Facial Expression Anxious;Animated  Affect Anxious  Speech Logical/coherent  Interaction Assertive  Motor Activity Restless  Appearance/Hygiene Unremarkable  Behavior Characteristics Anxious  Mood Anxious  Thought Process  Coherency WDL  Content WDL  Delusions WDL  Perception WDL  Hallucination None reported or observed  Judgment WDL  Confusion WDL  Danger to Self  Current suicidal ideation? Denies  Agreement Not to Harm Self Yes  Description of Agreement Verbal  Danger to Others  Danger to Others None reported or observed

## 2023-01-30 NOTE — Group Note (Signed)
Occupational Therapy Group Note  Group Topic:Coping Skills  Group Date: 01/30/2023 Start Time: 1430 End Time: 1510 Facilitators: Brantley Stage, OT   Group Description: Group encouraged increased engagement and participation through discussion and activity focused on "Coping Ahead." Patients were split up into teams and selected a card from a stack of positive coping strategies. Patients were instructed to act out/charade the coping skill for other peers to guess and receive points for their team. Discussion followed with a focus on identifying additional positive coping strategies and patients shared how they were going to cope ahead over the weekend while continuing hospitalization stay.  Therapeutic Goal(s): Identify positive vs negative coping strategies. Identify coping skills to be used during hospitalization vs coping skills outside of hospital/at home Increase participation in therapeutic group environment and promote engagement in treatment   Participation Level: Engaged   Participation Quality: Independent   Behavior: Appropriate   Speech/Thought Process: Relevant   Affect/Mood: Appropriate   Insight: Fair   Judgement: Fair   Individualization: pt was passively engaged in their participation of group discussion/activity. New skills were identified  Modes of Intervention: Education  Patient Response to Interventions:  Attentive   Plan: Continue to engage patient in OT groups 2 - 3x/week.  01/30/2023  Brantley Stage, OT  Cornell Barman, OT

## 2023-01-30 NOTE — Plan of Care (Signed)
  Problem: Activity: Goal: Interest or engagement in activities will improve Outcome: Progressing Goal: Sleeping patterns will improve Outcome: Progressing   Problem: Coping: Goal: Ability to verbalize frustrations and anger appropriately will improve Outcome: Progressing Goal: Ability to demonstrate self-control will improve Outcome: Progressing   

## 2023-01-30 NOTE — BHH Suicide Risk Assessment (Signed)
Suicide Risk Assessment  Admission Assessment    [facility] Admission Suicide Risk Assessment   Nursing information obtained from:  Patient Demographic factors:  NA Current Mental Status:  NA Loss Factors:  Loss of significant relationship Historical Factors:  NA Risk Reduction Factors:  Sense of responsibility to family  Total Time spent with patient: 1.5 hours Principal Problem: Adjustment disorder with mixed anxiety and depressed mood Diagnosis:  Principal Problem:   Adjustment disorder with mixed anxiety and depressed mood Active Problems:   Alcohol use disorder   Insomnia   Tobacco dependence   Delta-9-tetrahydrocannabinol (THC) dependence (HCC)  Subjective Data: Suicidal & homicidal ideations in the context of alcohol abuse & mourning recent death of father.   Continued Clinical Symptoms: Depressed and irritable mood, poor insight regarding needing treatment for stabilization of mental status.  Behavior behaviors and threats prior to hospitalization render patient at high risk of danger to self and others.  Currently requires hospitalization for treatment and stabilization of symptoms.  Alcohol Use Disorder Identification Test Final Score (AUDIT): 3 The "Alcohol Use Disorders Identification Test", Guidelines for Use in Primary Care, Second Edition.  World Pharmacologist [facility]). Score between 0-7:  no or low risk or alcohol related problems. Score between 8-15:  moderate risk of alcohol related problems. Score between 16-19:  high risk of alcohol related problems. Score 20 or above:  warrants further diagnostic evaluation for alcohol dependence and treatment.  CLINICAL FACTORS:   Depression:   Impulsivity Insomnia Severe Alcohol/Substance Abuse/Dependencies  Musculoskeletal: Strength & Muscle Tone: within normal limits Gait & Station: normal Patient leans: N/A  Psychiatric Specialty Exam:  Presentation  General Appearance:  Appropriate for Environment; Fairly  Groomed  Eye Contact: Good  Speech: Clear and Coherent  Speech Volume: Normal  Handedness: Right   Mood and Affect  Mood: Depressed; Anxious; Irritable  Affect: Congruent   Thought Process  Thought Processes: Coherent  Descriptions of Associations:Intact  Orientation:Full (Time, Place and Person)  Thought Content:Logical  History of Schizophrenia/Schizoaffective disorder:No  Duration of Psychotic Symptoms:No data recorded Hallucinations:Hallucinations: None  Ideas of Reference:None  Suicidal Thoughts:Suicidal Thoughts: No  Homicidal Thoughts:Homicidal Thoughts: No   Sensorium  Memory: Immediate Good  Judgment: Fair  Insight: Fair   Community education officer  Concentration: Fair  Attention Span: Fair  Recall: [place] of Knowledge: Fair  Language: Fair   Psychomotor Activity  Psychomotor Activity: Psychomotor Activity: Normal   Assets  Assets: Resilience; Social Support; Housing   Sleep  Sleep: Sleep: Poor    Physical Exam: Physical Exam Constitutional:      Appearance: Normal appearance.  HENT:     Head: Normocephalic.     Nose: Nose normal.  Eyes:     Pupils: Pupils are equal, round, and reactive to light.  Musculoskeletal:        General: Normal range of motion.     Cervical back: Normal range of motion.  Neurological:     Mental Status: She is alert and oriented to person, place, and time.    Review of Systems  Constitutional: Negative.   HENT: Negative.    Eyes: Negative.   Respiratory: Negative.    Cardiovascular: Negative.   Gastrointestinal: Negative.   Genitourinary: Negative.   Musculoskeletal: Negative.   Skin:  Negative for rash.  Neurological: Negative.   Psychiatric/Behavioral:  Positive for depression and substance abuse. Negative for hallucinations, memory loss and suicidal ideas. The patient is nervous/anxious and has insomnia.    Blood pressure (!) 145/90, pulse 73,  temperature 97.9 F  (36.6 C), temperature source Oral, resp. rate 16, height '5\' 1"'$  (1.549 m), weight 60.8 kg, last menstrual period 04/11/2020, SpO2 100 %. Body mass index is 25.32 kg/m.   COGNITIVE FEATURES THAT CONTRIBUTE TO RISK:  None    SUICIDE RISK:   Mild:  Suicidal ideation of limited frequency, intensity, duration, and specificity.  There are no identifiable plans, no associated intent, mild dysphoria and related symptoms, good self-control (both objective and subjective assessment), few other risk factors, and identifiable protective factors, including available and accessible social support.  PLAN OF CARE: See H & P  I certify that inpatient services furnished can reasonably be expected to improve the patient's condition.   Nicholes Rough, NP 01/30/2023, 7:00 PM

## 2023-01-30 NOTE — Progress Notes (Signed)
PRN PO haldol 5 mg and atarax 25 mg given to patient at 1640 for agitation and anxiety. Patient tolerated medication well with no side effect noted. Staff will continue provide support to patient.

## 2023-01-30 NOTE — Plan of Care (Signed)
  Problem: Safety: Goal: Periods of time without injury will increase Outcome: Progressing   

## 2023-01-30 NOTE — B[facility] Group Notes (Signed)
Spiritual care group on grief and loss facilitated by Chaplain Janne Napoleon, Bcc and Lysle Morales, counseling intern.  Group Goal: Support / Education around grief and loss  Members engage in facilitated group support and psycho-social education.  Group Description:  Following introductions and group rules, group members engaged in facilitated group dialogue and support around topic of loss, with particular support around experiences of loss in their lives. Group Identified types of loss (relationships / self / things) and identified patterns, circumstances, and changes that precipitate losses. Reflected on thoughts / feelings around loss, normalized grief responses, and recognized variety in grief experience. Group encouraged individual reflection on safe space and on the coping skills that they are already utilizing.  Group drew on Adlerian / Rogerian and narrative framework  Patient Progress: [name] attended group and participated in group activities.  Though her verbal participation was minimal, she remained engaged for the duration of the session and was supportive to her peers.  [address] Dora Pager, [phone]

## 2023-01-30 NOTE — BHH Group Notes (Signed)
Adult Psychoeducational Group Note  Date:  01/30/2023 Time:  4:49 PM  Group Topic/Focus:  Healthy Communication:   The focus of this group is to discuss communication, barriers to communication, as well as healthy ways to communicate with others.  Participation Level:  Active  Participation Quality:  Attentive  Affect:  Appropriate  Cognitive:  Alert  Insight: Appropriate  Engagement in Group:  Engaged  Modes of Intervention:  Activity  Additional Comments:  Patient attended and participated in the [place] group.  Annie Sable 01/30/2023, 4:49 PM

## 2023-01-30 NOTE — Progress Notes (Signed)
   01/30/23 2116  Psych Admission Type (Psych Patients Only)  Admission Status Involuntary  Psychosocial Assessment  Patient Complaints Anxiety  Eye Contact Avertive  Facial Expression Anxious  Affect Anxious;Sad;Depressed  Speech Logical/coherent  Interaction Assertive  Motor Activity Other (Comment) (unremarkable)  Appearance/Hygiene Unremarkable  Behavior Characteristics Anxious  Mood Anxious  Thought Process  Coherency WDL  Content WDL  Delusions None reported or observed  Perception WDL  Hallucination None reported or observed  Judgment Impaired  Confusion WDL  Danger to Self  Current suicidal ideation? Denies  Agreement Not to Harm Self Yes  Description of Agreement Verbal  Danger to Others  Danger to Others None reported or observed   Alert/oriented. Makes needs/concerns known to staff. Pleasant cooperative with staff. Denies SI/HI/A/V hallucinations. Med compliant Patient states went to group. Will encourage continue compliance and progression towards goals. Verbally contracted for safety. Will continue to monitor.

## 2023-01-30 NOTE — B[facility] Group Notes (Signed)
Adult Psychoeducational Group Note  Date:  01/30/2023 Time:  1:31 PM  Group Topic/Focus:  Wellness Toolbox:   The focus of this group is to discuss various aspects of wellness, balancing those aspects and exploring ways to increase the ability to experience wellness.  Patients will create a wellness toolbox for use upon discharge.  Participation Level:  Active  Participation Quality:  Appropriate  Affect:  Appropriate  Cognitive:  Appropriate  Insight: Good  Engagement in Group:  Engaged  Modes of Intervention:  Activity  Additional Comments:  Patient attended the [place] group.  Annie Sable 01/30/2023, 1:31 PM

## 2023-01-30 NOTE — B[facility] Group Notes (Signed)
[facility] Group Notes:  (Nursing/MHT/Case Management/Adjunct)  Date:  01/30/2023  Time:  8:42 PM  Type of Therapy:   AA  Participation Level:  Active  Participation Quality:  Appropriate  Affect:  Appropriate  Cognitive:  Appropriate  Insight:  Appropriate  Engagement in Group:  Engaged  Modes of Intervention:  Education  Summary of Progress/Problems:  Pt attended [place] group.  Orvan Falconer 01/30/2023, 8:42 PM

## 2023-01-30 NOTE — Progress Notes (Signed)
Patient ID: [name], female   DOB: Apr 13, 1971, 52 y.o.   MRN: [id number] Alert oriented. Pleasant/cooperative.  Transferred  from [facility] ED to [facility] rm 405-1. IVC. SI, however patient '@this'$  time denies SI/HI, denies A/V hallucinations. Patient reports Depression, insomnia, anxiety, use of THC.  Presenty not prescribed any medications. Initial assessment completed. Belongings secured in locker. 15 min checks completed. Patient verbally contracted for safety. Will continue to monitor.

## 2023-01-30 NOTE — Group Note (Signed)
Date:  01/30/2023 Time:  9:39 AM  Group Topic/Focus:  Goals Group:   The focus of this group is to help patients establish daily goals to achieve during treatment and discuss how the patient can incorporate goal setting into their daily lives to aide in recovery. Orientation:   The focus of this group is to educate the patient on the purpose and policies of crisis stabilization and provide a format to answer questions about their admission.  The group details unit policies and expectations of patients while admitted.    Participation Level:  Active  Participation Quality:  Appropriate  Affect:  Appropriate  Cognitive:  Appropriate  Insight: Appropriate  Engagement in Group:  Engaged  Modes of Intervention:  Discussion  Additional Comments:    Dub Mikes 01/30/2023, 9:39 AM

## 2023-01-30 NOTE — Group Note (Signed)
Recreation Therapy Group Note   Group Topic:Stress Management  Group Date: 01/30/2023 Start Time: 0940 End Time: 0957 Facilitators: Cleota Pellerito-McCall, LRT,CTRS Location: [address] Dayroom   Goal Area(s) Addresses:  Patient will identify positive stress management techniques. Patient will identify benefits of using stress management post d/c.  Group Description:  Meditation.  LRT and patients discussed ways to handle stress.  LRT played a meditation that focused on making the most of your day and encouraging each person to have the confidence and courage to know they can change their environment and self-esteem.    Affect/Mood: Appropriate   Participation Level: Engaged   Participation Quality: Independent   Behavior: Appropriate   Speech/Thought Process: Focused   Insight: Good   Judgement: Good   Modes of Intervention: Meditation   Patient Response to Interventions:  Engaged   Education Outcome:  Acknowledges education and In group clarification offered    Clinical Observations/Individualized Feedback: Pt attended and participated in group session.    Plan: Continue to engage patient in RT group sessions 2-3x/week.   Tejas Seawood-McCall, LRT,CTRS  01/30/2023 1:01 PM

## 2023-01-30 NOTE — Group Note (Unsigned)
Date:  01/30/2023 Time:  10:23 AM  Group Topic/Focus:  Goals Group:   The focus of this group is to help patients establish daily goals to achieve during treatment and discuss how the patient can incorporate goal setting into their daily lives to aide in recovery. Orientation:   The focus of this group is to educate the patient on the purpose and policies of crisis stabilization and provide a format to answer questions about their admission.  The group details unit policies and expectations of patients while admitted.     Participation Level:  {BHH PARTICIPATION WO:[id number]  Participation Quality:  {BHH PARTICIPATION QUALITY:22265}  Affect:  {BHH AFFECT:22266}  Cognitive:  {BHH COGNITIVE:22267}  Insight: {BHH Insight2:20797}  Engagement in Group:  {BHH ENGAGEMENT IN BP:8198245  Modes of Intervention:  {BHH MODES OF INTERVENTION:22269}  Additional Comments:  ***  Dub Mikes 01/30/2023, 10:23 AM

## 2023-01-31 MED ORDER — NICOTINE POLACRILEX 2 MG MT GUM
2.0000 mg | CHEWING_GUM | OROMUCOSAL | Status: DC | PRN
  Administered 2023-01-31 – 2023-02-01 (×3): 2 mg via ORAL

## 2023-01-31 MED ORDER — CLONIDINE HCL 0.1 MG PO TABS
0.1000 mg | ORAL_TABLET | Freq: Two times a day (BID) | ORAL | Status: DC | PRN
  Administered 2023-02-01: 0.1 mg via ORAL
  Filled 2023-01-31: qty 1

## 2023-01-31 MED ORDER — METOPROLOL TARTRATE 50 MG PO TABS
50.0000 mg | ORAL_TABLET | Freq: Two times a day (BID) | ORAL | Status: DC
  Administered 2023-02-01 – 2023-02-02 (×3): 50 mg via ORAL
  Filled 2023-01-31: qty 1
  Filled 2023-01-31: qty 14
  Filled 2023-01-31 (×5): qty 1
  Filled 2023-01-31: qty 14

## 2023-01-31 NOTE — Group Note (Unsigned)
Date:  01/31/2023 Time:  9:16 PM  Group Topic/Focus:  Wrap-Up Group:   The focus of this group is to help patients review their daily goal of treatment and discuss progress on daily workbooks.     Participation Level:  {BHH PARTICIPATION HD:996081  Participation Quality:  {BHH PARTICIPATION QUALITY:22265}  Affect:  {BHH AFFECT:22266}  Cognitive:  {BHH COGNITIVE:22267}  Insight: {BHH Insight2:20797}  Engagement in Group:  {BHH ENGAGEMENT IN JY:[id number]  Modes of Intervention:  {BHH MODES OF INTERVENTION:22269}  Additional Comments:  ***  Debe Coder 01/31/2023, 9:16 PM

## 2023-01-31 NOTE — Group Note (Signed)
Date:  01/31/2023 Time:  10:42 AM  Group Topic/Focus:  Making Healthy Choices:   The focus of this group is to help patients identify negative/unhealthy choices they were using prior to admission and identify positive/healthier coping strategies to replace them upon discharge.    Participation Level:  Active  Participation Quality:  Appropriate  Affect:  Appropriate  Cognitive:  Appropriate  Insight: Appropriate  Engagement in Group:  Engaged  Modes of Intervention:  Discussion  Additional Comments:   Patient watch mental health video, as they ate their snacks.   [name] W Arilynn Blakeney [id number], 10:42 AM

## 2023-01-31 NOTE — Group Note (Unsigned)
Date:  01/31/2023 Time:  9:01 PM  Group Topic/Focus:  Wrap-Up Group:   The focus of this group is to help patients review their daily goal of treatment and discuss progress on daily workbooks.     Participation Level:  {BHH PARTICIPATION WO:[id number]  Participation Quality:  {BHH PARTICIPATION QUALITY:22265}  Affect:  {BHH AFFECT:22266}  Cognitive:  {BHH COGNITIVE:22267}  Insight: {BHH Insight2:20797}  Engagement in Group:  {BHH ENGAGEMENT IN BP:8198245  Modes of Intervention:  {BHH MODES OF INTERVENTION:22269}  Additional Comments:  ***  Debe Coder 01/31/2023, 9:01 PM

## 2023-01-31 NOTE — Group Note (Unsigned)
Date:  01/31/2023 Time:  8:45 PM  Group Topic/Focus:  Wrap-Up Group:   The focus of this group is to help patients review their daily goal of treatment and discuss progress on daily workbooks.     Participation Level:  {BHH PARTICIPATION WO:[id number]  Participation Quality:  {BHH PARTICIPATION QUALITY:22265}  Affect:  {BHH AFFECT:22266}  Cognitive:  {BHH COGNITIVE:22267}  Insight: {BHH Insight2:20797}  Engagement in Group:  {BHH ENGAGEMENT IN BP:8198245  Modes of Intervention:  {BHH MODES OF INTERVENTION:22269}  Additional Comments:  ***  Debe Coder 01/31/2023, 8:45 PM

## 2023-01-31 NOTE — Group Note (Signed)
LCSW Group Therapy Note   Group Date: 01/31/2023 Start Time: 1100 End Time: 1200  Type of Therapy and Topic:  Group Therapy:  Self-Care after Hospitalization  Participation Level:  Active   Description of Group This process group involved patients discussing how they plan to take care of themselves in a better manner when they get home from the hospital.  The group started with patients listing one healthy self-care they hope to engage in at discharge that they did not use prior to admission.  We discussed a variety of other means of self-care which had a large range from hygiene activities to eating to setting boundaries to participating in peer support groups.  The primary focus by CSW was to point out commonalities.  When there were participants who stated everyone else can get help, but they themselves are "beyond help," this was discussed in detail and this belief was gently challenged.  Finally, it was announced that immediately following group was to be "Hygiene Hour" where everyone would go to their rooms and take care of their personal hygiene.  This was met with wide acceptance.  Therapeutic Goals Patient will identify and describe one self-care activity to deliberately plan to use upon hospital discharge Patient will participate in generating additional ideas about healthy self-care options when they return to the community Patients will be supportive of one another and receive support from others Patients will be challenged to realize that they are not "beyond help" any more than other participants in the room are  Summary of Patient Progress:  The Pt attended group and remained there the entire time.  The Pt accepted all worksheets and materials provided.  The Pt demonstrated understanding of the topic being discussed by asking questions and participating in open discussion with their peers and staff.  The Pt was appropriate with all peers and staff.  The Pt was able to create a plan  of action and set goals for after discharge.    Therapeutic Modalities Brief Solution-Focused Therapy Psychoeducation  Darleen Crocker, [place] 01/31/2023  1:07 PM

## 2023-01-31 NOTE — Group Note (Unsigned)
Date:  01/31/2023 Time:  8:56 PM  Group Topic/Focus:  Wrap-Up Group:   The focus of this group is to help patients review their daily goal of treatment and discuss progress on daily workbooks.     Participation Level:  {BHH PARTICIPATION HD:[id number]  Participation Quality:  {BHH PARTICIPATION QUALITY:22265}  Affect:  {BHH AFFECT:22266}  Cognitive:  {BHH COGNITIVE:22267}  Insight: {BHH Insight2:20797}  Engagement in Group:  {BHH ENGAGEMENT IN JY:[id number]  Modes of Intervention:  {BHH MODES OF INTERVENTION:22269}  Additional Comments:  ***  Debe Coder 01/31/2023, 8:56 PM

## 2023-01-31 NOTE — Group Note (Unsigned)
Date:  01/31/2023 Time:  8:50 PM  Group Topic/Focus:  Wrap-Up Group:   The focus of this group is to help patients review their daily goal of treatment and discuss progress on daily workbooks.     Participation Level:  {BHH PARTICIPATION WO:[id number]  Participation Quality:  {BHH PARTICIPATION QUALITY:22265}  Affect:  {BHH AFFECT:22266}  Cognitive:  {BHH COGNITIVE:22267}  Insight: {BHH Insight2:20797}  Engagement in Group:  {BHH ENGAGEMENT IN BP:8198245  Modes of Intervention:  {BHH MODES OF INTERVENTION:22269}  Additional Comments:  ***  Debe Coder 01/31/2023, 8:50 PM

## 2023-01-31 NOTE — Progress Notes (Signed)
Adult Psychoeducational Group Note  Date:  01/31/2023 Time:  9:38 PM  Group Topic/Focus:  Wrap-Up Group:   The focus of this group is to help patients review their daily goal of treatment and discuss progress on daily workbooks.  Participation Level:  Active  Participation Quality:  Appropriate  Affect:  Appropriate  Cognitive:  Appropriate  Insight: Appropriate  Engagement in Group:  Engaged  Modes of Intervention:  Education and Exploration  Additional Comments:  Patient attended and participated in group tonight. She reports that today she had lots of jokes and she found out she will go home on Thursday.  Salley Scarlet [facility] 01/31/2023, 9:38 PM

## 2023-01-31 NOTE — Progress Notes (Signed)
   01/31/23 1300  Psych Admission Type (Psych Patients Only)  Admission Status Involuntary  Psychosocial Assessment  Patient Complaints Anxiety  Eye Contact Brief  Facial Expression Anxious  Affect Anxious  Speech Logical/coherent  Interaction Assertive  Motor Activity Other (Comment) (WNL)  Appearance/Hygiene Unremarkable  Behavior Characteristics Anxious  Mood Anxious  Thought Process  Coherency WDL  Content WDL  Delusions None reported or observed  Perception WDL  Hallucination None reported or observed  Judgment Poor  Confusion WDL  Danger to Self  Current suicidal ideation? Denies  Danger to Others  Danger to Others None reported or observed   Pt denied SI/HI/AVH.  Pt interacting with peers and participating in groups.  Pt took medications without incident.  Pt is pleasant and cooperative with staff.

## 2023-01-31 NOTE — BHH Suicide Risk Assessment (Signed)
[place] INPATIENT:  Family/Significant Other Suicide Prevention Education  Suicide Prevention Education:  Education Completed; Amyriah Gartrell [phone] (Husband) has been identified by the patient as the family member/significant other with whom the patient will be residing, and identified as the person(s) who will aid the patient in the event of a mental health crisis (suicidal ideations/suicide attempt).  With written consent from the patient, the family member/significant other has been provided the following suicide prevention education, prior to the and/or following the discharge of the patient.  The suicide prevention education provided includes the following: Suicide risk factors Suicide prevention and interventions National Suicide Hotline telephone number [facility] assessment telephone number [facility] Emergency Assistance [place] and/or Residential Mobile Crisis Unit telephone number  Request made of family/significant other to: Remove weapons (e.g., guns, rifles, knives), all items previously/currently identified as safety concern.   Remove drugs/medications (over-the-counter, prescriptions, illicit drugs), all items previously/currently identified as a safety concern.  The family member/significant other verbalizes understanding of the suicide prevention education information provided.  The family member/significant other agrees to remove the items of safety concern listed above.  CSW spoke with Mr. Casterline who states that his wife can return to the home after discharge.  However, he states that he will likely leave the home after she discharges from the hospital.  He states that he does not want her to be made aware of this information prior to discharge.  He states that there are firearms in the home but that he will be moving those firearms out of the home prior to her return to the home.  He states "my wife has a severe alcohol issue and she gets  angry and violent when she is drinking".  He states that his wife will be living at the home alone after discharge.  CSW completed SPE with Mr. Winiarski.   Frutoso Chase Charita Lindenberger 01/31/2023, 1:23 PM

## 2023-01-31 NOTE — Group Note (Signed)
Date:  01/31/2023 Time:  10:13 AM  Group Topic/Focus:  Goals Group:   The focus of this group is to help patients establish daily goals to achieve during treatment and discuss how the patient can incorporate goal setting into their daily lives to aide in recovery. Orientation:   The focus of this group is to educate the patient on the purpose and policies of crisis stabilization and provide a format to answer questions about their admission.  The group details unit policies and expectations of patients while admitted.    Participation Level:  Active  Participation Quality:  Appropriate  Affect:  Appropriate  Cognitive:  Appropriate  Insight: Appropriate  Engagement in Group:  Engaged  Modes of Intervention:  Discussion  Additional Comments: Patient attended morning orientation/goal setting  group and said that his goal for today is to discuss her discharged plan.   [place] Jarquin W Freddrick Gladson [id number], 10:13 AM

## 2023-01-31 NOTE — Group Note (Unsigned)
Date:  01/31/2023 Time:  8:58 PM  Group Topic/Focus:  Wrap-Up Group:   The focus of this group is to help patients review their daily goal of treatment and discuss progress on daily workbooks.     Participation Level:  {BHH PARTICIPATION WO:[id number]  Participation Quality:  {BHH PARTICIPATION QUALITY:22265}  Affect:  {BHH AFFECT:22266}  Cognitive:  {BHH COGNITIVE:22267}  Insight: {BHH Insight2:20797}  Engagement in Group:  {BHH ENGAGEMENT IN BP:8198245  Modes of Intervention:  {BHH MODES OF INTERVENTION:22269}  Additional Comments:  ***  Debe Coder 01/31/2023, 8:58 PM
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
## 2023-01-31 NOTE — Group Note (Unsigned)
Date:  01/31/2023 Time:  8:54 PM  Group Topic/Focus:  Wrap-Up Group:   The focus of this group is to help patients review their daily goal of treatment and discuss progress on daily workbooks.     Participation Level:  {BHH PARTICIPATION HD:996081  Participation Quality:  {BHH PARTICIPATION QUALITY:22265}  Affect:  {BHH AFFECT:22266}  Cognitive:  {BHH COGNITIVE:22267}  Insight: {BHH Insight2:20797}  Engagement in Group:  {BHH ENGAGEMENT IN JY:[id number]  Modes of Intervention:  {BHH MODES OF INTERVENTION:22269}  Additional Comments:  ***  Debe Coder 01/31/2023, 8:54 PM

## 2023-01-31 NOTE — Group Note (Signed)
Recreation Therapy Group Note   Group Topic:Animal Assisted Therapy   Group Date: 01/31/2023 Start Time: 1430 End Time: 1500 Facilitators: Zavon Hyson-McCall, LRT,CTRS Location: [address]   Animal-Assisted Activity (AAA) Program Checklist/Progress Notes Patient Eligibility Criteria Checklist & Daily Group note for Rec Tx Intervention  AAA/T Program Assumption of Risk Form signed by Patient/ or Parent Legal Guardian Yes  Patient is free of allergies or severe asthma Yes  Patient reports no fear of animals Yes  Patient reports no history of cruelty to animals Yes  Patient understands his/her participation is voluntary Yes  Patient washes hands before animal contact Yes  Patient washes hands after animal contact Yes  Affect/Mood: Appropriate   Participation Level: Engaged   Participation Quality: Independent   Behavior: Appropriate    Clinical Observations/Individualized Feedback:  Patient attended session and interacted appropriately with therapy dog and peers. Patient asked appropriate questions about therapy dog and his training. Patient shared stories about their pets at home with group.    Plan: Continue to engage patient in RT group sessions 2-3x/week.   Bryor Rami-McCall, LRT,CTRS 01/31/2023 4:24 PM

## 2023-02-01 DIAGNOSIS — F4323 Adjustment disorder with mixed anxiety and depressed mood: Principal | ICD-10-CM

## 2023-02-01 LAB — LIPID PANEL
Cholesterol: 190 mg/dL (ref 0–200)
HDL: 77 mg/dL (ref >40)
LDL Cholesterol: 100 mg/dL — ABNORMAL HIGH (ref 0–99)
Total CHOL/HDL Ratio: 2.5 RATIO
Triglycerides: 67 mg/dL (ref <150)
VLDL: 13 mg/dL (ref 0–40)

## 2023-02-01 LAB — TSH: TSH: 1.423 u[IU]/mL (ref 0.350–4.500)

## 2023-02-01 LAB — HEMOGLOBIN A1C
Hgb A1c MFr Bld: 5.9 % — ABNORMAL HIGH (ref 4.8–5.6)
Mean Plasma Glucose: 123 mg/dL

## 2023-02-01 LAB — VITAMIN D 25 HYDROXY (VIT D DEFICIENCY, FRACTURES): Vit D, 25-Hydroxy: 5.93 ng/mL — ABNORMAL LOW (ref 30–100)

## 2023-02-01 NOTE — Group Note (Signed)
Recreation Therapy Group Note   Group Topic:Leisure Education  Group Date: 02/01/2023 Start Time: 1400 End Time: [id number] Facilitators: Xin Klawitter-McCall, LRT,CTRS Location: [address]   Activity Description/Intervention: Therapeutic Drumming. Patients with peers and staff were given the opportunity to engage in a leader facilitated [place] with staff from the [employer], in partnership with [employer]. Nurse, adult and trained [employer], Devin Going leading with LRT observing and documenting intervention and pt response. This evidenced-based practice targets 7 areas of health and wellbeing in the human experience including: stress-reduction, exercise, self-expression, camaraderie/support, nurturing, spirituality, and music-making (leisure).   Goal Area(s) Addresses:  Patient will engage in pro-social way in music group.  Patient will follow directions of drum leader on the first prompt. Patient will demonstrate no behavioral issues during group.  Patient will identify if a reduction in stress level occurs as a result of participation in therapeutic drum circle.     Affect/Mood: Appropriate   Participation Level: Engaged   Participation Quality: Independent   Behavior: Appropriate   Speech/Thought Process: Focused   Insight: Good   Judgement: Good   Modes of Intervention: Nurse, adult   Patient Response to Interventions:  Engaged   Education Outcome:  Acknowledges education and In group clarification offered    Clinical Observations/Individualized Feedback: Pt was attentive and engaged during group session.  Pt was appropriate and bright during group session.    Plan: Continue to engage patient in RT group sessions 2-3x/week.   Kalliopi Coupland-McCall, LRT,CTRS 02/01/2023 3:50 PM

## 2023-02-01 NOTE — Progress Notes (Signed)
D: Patient alert and oriented, able to make needs known. Denies SI/HI, AVH at present. Denies pain at present. Patient goal today "to stay positive." Rates depression 0/10, hopelessness 0/10, and anxiety 0/10. Patient reports energy level as normal. She reports she slept fair last night. Patient does not request any PRN medication at this time.   A: Scheduled medications administered to patient per MD order. Support and encouragement provided. Routine safety checks conducted every fifteen minutes. Patient informed to notify staff with problems or concerns. Frequent verbal contact made.   R: No adverse drug reactions noted. Patient contracts for safety at this time. Patient is compliant with medications and treatment plan. Patient receptive, calm and cooperative. Patient interacts with others appropriately on unit at present. Patient remains safe at present.

## 2023-02-01 NOTE — Group Note (Unsigned)
Date:  02/01/2023 Time:  4:34 PM  Group Topic/Focus:  Healthy Communication:   The focus of this group is to discuss communication, barriers to communication, as well as healthy ways to communicate with others. Making Healthy Choices:   The focus of this group is to help patients identify negative/unhealthy choices they were using prior to admission and identify positive/healthier coping strategies to replace them upon discharge.     Participation Level:  {BHH PARTICIPATION HD:996081  Participation Quality:  {BHH PARTICIPATION QUALITY:22265}  Affect:  {BHH AFFECT:22266}  Cognitive:  {BHH COGNITIVE:22267}  Insight: {BHH Insight2:20797}  Engagement in Group:  {BHH ENGAGEMENT IN JY:[id number]  Modes of Intervention:  {BHH MODES OF INTERVENTION:22269}  Additional Comments:  ***  Garvin Fila 02/01/2023, 4:34 PM

## 2023-02-01 NOTE — Group Note (Unsigned)
Date:  02/01/2023 Time:  4:35 PM  Group Topic/Focus:  Healthy Communication:   The focus of this group is to discuss communication, barriers to communication, as well as healthy ways to communicate with others. Making Healthy Choices:   The focus of this group is to help patients identify negative/unhealthy choices they were using prior to admission and identify positive/healthier coping strategies to replace them upon discharge.     Participation Level:  {BHH PARTICIPATION HD:996081  Participation Quality:  {BHH PARTICIPATION QUALITY:22265}  Affect:  {BHH AFFECT:22266}  Cognitive:  {BHH COGNITIVE:22267}  Insight: {BHH Insight2:20797}  Engagement in Group:  {BHH ENGAGEMENT IN JY:[id number]  Modes of Intervention:  {BHH MODES OF INTERVENTION:22269}  Additional Comments:  ***  Garvin Fila 02/01/2023, 4:35 PM

## 2023-02-01 NOTE — Group Note (Unsigned)
Date:  02/01/2023 Time:  9:45 AM  Group Topic/Focus:  Goals Group:   The focus of this group is to help patients establish daily goals to achieve during treatment and discuss how the patient can incorporate goal setting into their daily lives to aide in recovery. Orientation:   The focus of this group is to educate the patient on the purpose and policies of crisis stabilization and provide a format to answer questions about their admission.  The group details unit policies and expectations of patients while admitted.     Participation Level:  {BHH PARTICIPATION HD:996081  Participation Quality:  {BHH PARTICIPATION QUALITY:22265}  Affect:  {BHH AFFECT:22266}  Cognitive:  {BHH COGNITIVE:22267}  Insight: {BHH Insight2:20797}  Engagement in Group:  {BHH ENGAGEMENT IN JY:[id number]  Modes of Intervention:  {BHH MODES OF INTERVENTION:22269}  Additional Comments:  ***  [name] 02/01/2023, 9:45 AM

## 2023-02-01 NOTE — Group Note (Signed)
Date:  02/01/2023 Time:  9:57 AM  Group Topic/Focus:  Goals Group:   The focus of this group is to help patients establish daily goals to achieve during treatment and discuss how the patient can incorporate goal setting into their daily lives to aide in recovery. Orientation:   The focus of this group is to educate the patient on the purpose and policies of crisis stabilization and provide a format to answer questions about their admission.  The group details unit policies and expectations of patients while admitted.    Participation Level:  Active  Participation Quality:  Appropriate  Affect:  Appropriate  Cognitive:  Appropriate  Insight: Appropriate  Engagement in Group:  Engaged  Modes of Intervention:  Discussion  Additional Comments:  Pt was engaged and appropriate for group Garvin Fila 02/01/2023, 9:57 AM
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
## 2023-02-01 NOTE — Group Note (Unsigned)
Date:  02/01/2023 Time:  9:53 AM  Group Topic/Focus:  Goals Group:   The focus of this group is to help patients establish daily goals to achieve during treatment and discuss how the patient can incorporate goal setting into their daily lives to aide in recovery. Orientation:   The focus of this group is to educate the patient on the purpose and policies of crisis stabilization and provide a format to answer questions about their admission.  The group details unit policies and expectations of patients while admitted.     Participation Level:  {BHH PARTICIPATION WO:[id number]  Participation Quality:  {BHH PARTICIPATION QUALITY:22265}  Affect:  {BHH AFFECT:22266}  Cognitive:  {BHH COGNITIVE:22267}  Insight: {BHH Insight2:20797}  Engagement in Group:  {BHH ENGAGEMENT IN BP:[id number]  Modes of Intervention:  {BHH MODES OF INTERVENTION:22269}  Additional Comments:  ***  Garvin Fila 02/01/2023, 9:53 AM

## 2023-02-01 NOTE — Progress Notes (Signed)
   02/01/23 0604  15 Minute Checks  Location Bedroom  Visual Appearance Calm  Behavior Sleeping  Sleep (Behavioral Health Patients Only)  Calculate sleep? (Click Yes once per 24 hr at 0600 safety check) Yes  Documented sleep last 24 hours 8.25

## 2023-02-01 NOTE — Plan of Care (Signed)
  Problem: Education: Goal: Knowledge of [facility] Education information/materials will improve Outcome: Progressing Goal: Emotional status will improve Outcome: Progressing Goal: Mental status will improve Outcome: Progressing Goal: Verbalization of understanding the information provided will improve Outcome: Progressing   Problem: Activity: Goal: Interest or engagement in activities will improve Outcome: Progressing Goal: Sleeping patterns will improve Outcome: Progressing   Problem: Coping: Goal: Ability to verbalize frustrations and anger appropriately will improve Outcome: Progressing Goal: Ability to demonstrate self-control will improve Outcome: Progressing   Problem: Health Behavior/Discharge Planning: Goal: Identification of resources available to assist in meeting health care needs will improve Outcome: Progressing Goal: Compliance with treatment plan for underlying cause of condition will improve Outcome: Progressing   Problem: Physical Regulation: Goal: Ability to maintain clinical measurements within normal limits will improve Outcome: Progressing   Problem: Safety: Goal: Periods of time without injury will increase Outcome: Progressing   Problem: Education: Goal: Utilization of techniques to improve thought processes will improve Outcome: Progressing Goal: Knowledge of the prescribed therapeutic regimen will improve Outcome: Progressing

## 2023-02-01 NOTE — Group Note (Signed)
Date:  02/01/2023 Time:  4:41 PM  Group Topic/Focus:  Healthy Communication:   The focus of this group is to discuss communication, barriers to communication, as well as healthy ways to communicate with others. Making Healthy Choices:   The focus of this group is to help patients identify negative/unhealthy choices they were using prior to admission and identify positive/healthier coping strategies to replace them upon discharge.    Participation Level:  Did Not Attend  Participation Quality:    Affect:    Cognitive:   Insight:   Engagement in Group:    Modes of Intervention:    Additional Comments:    Garvin Fila 02/01/2023, 4:41 PM

## 2023-02-02 MED ORDER — ESCITALOPRAM OXALATE 10 MG PO TABS: 10.0000 mg | ORAL_TABLET | Freq: Every day | ORAL | 0 refills | Status: AC

## 2023-02-02 MED ORDER — METOPROLOL TARTRATE 50 MG PO TABS: 50.0000 mg | ORAL_TABLET | Freq: Two times a day (BID) | ORAL | 0 refills | Status: AC

## 2023-02-02 MED ORDER — NICOTINE POLACRILEX 2 MG MT GUM: 2.0000 mg | CHEWING_GUM | OROMUCOSAL | 0 refills | Status: AC | PRN

## 2023-02-02 MED ORDER — AMLODIPINE BESYLATE 10 MG PO TABS: 10.0000 mg | ORAL_TABLET | Freq: Every day | ORAL | 0 refills | Status: AC

## 2023-02-02 MED ORDER — NALTREXONE HCL 50 MG PO TABS: 50.0000 mg | ORAL_TABLET | Freq: Every day | ORAL | 0 refills | Status: AC
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
## 2023-02-02 NOTE — Progress Notes (Signed)
  [facility] Adult Case Management Discharge Plan :  Will you be returning to the same living situation after discharge:  Yes,  Home with Husband  At discharge, do you have transportation home?: Yes,  Husband  Do you have the ability to pay for your medications: Yes,  Employment   Release of information consent forms completed and in the chart;  Patient's signature needed at discharge.  Patient to Follow up at:  [facility]. Go on 02/06/2023.   Specialty: Behavioral Health Why: Please go to this provider for an assessment, to obtain therapy and medication management services on Monday through Friday, arribe by 7:20 am. Assessments are provided on a first come, first served basis. Contact information: [place] [phone]                Next level of care provider has access to [place] and Suicide Prevention discussed: Yes,  with patient and husband      Has patient been referred to the Quitline?: Patient refused referral  Patient has been referred for addiction treatment: Pt. refused referral, Patient may inquire about SAIOP services during intake appointment.   Darleen Crocker, LCSWA 02/02/2023, 9:43 AM

## 2023-02-02 NOTE — [facility] Group Notes (Signed)
Adult Psychoeducational Group Note  Date:  02/02/2023 Time:  9:41 AM  Group Topic/Focus:  Goals Group:   The focus of this group is to help patients establish daily goals to achieve during treatment and discuss how the patient can incorporate goal setting into their daily lives to aide in recovery.  Participation Level:  Active  Participation Quality:  Appropriate  Affect:  Appropriate  Cognitive:  Appropriate  Insight: Appropriate  Engagement in Group:  Engaged  Modes of Intervention:  Education  Additional Comments:  PT did Participate, PT spoke on how she was going to use her coping skills to maintain once she leaves.  Camila Li 02/02/2023, 9:41 AM

## 2023-02-02 NOTE — Progress Notes (Signed)
[facility] Group Notes:  (Nursing/MHT/Case Management/Adjunct)  Date:  02/01/2023 Time:  2000  Type of Therapy:   NA Meeting  Participation Level:    Participation Quality:  Appropriate and Attentive  Affect:  Anxious  Cognitive:  Alert  Insight:    Engagement in Group:  Engaged  Modes of Intervention:  Education and Support  Summary of Progress/Problems:  Shellia Cleverly 02/02/2023, 12:13 AM

## 2023-02-02 NOTE — Progress Notes (Signed)
   02/02/23 0600  15 Minute Checks  Location Bedroom  Visual Appearance Calm  Behavior Sleeping  Sleep (Behavioral Health Patients Only)  Calculate sleep? (Click Yes once per 24 hr at 0600 safety check) Yes  Documented sleep last 24 hours 8.25
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
## 2023-02-02 NOTE — Progress Notes (Signed)
Patient discharged from [facility] on 02/02/23 at 1030. Patient denies SI, plan, and intention. Suicide safety plan completed, reviewed with this RN, given to the patient, and a copy in the chart. Patient denies HI/AVH upon discharge.  Patient is alert, oriented, and cooperative. RN provided patient with discharge paperwork and reviewed information with patient. Patient expressed that she understood all of the discharge instructions. Pt was satisfied with belongings returned to her from the locker and at bedside. Discharged patient to [facility] waiting room.

## 2023-08-17 ENCOUNTER — Ambulatory Visit (INDEPENDENT_AMBULATORY_CARE_PROVIDER_SITE_OTHER): Payer: Self-pay

## 2023-08-17 ENCOUNTER — Encounter (HOSPITAL_COMMUNITY): Payer: Self-pay | Admitting: Family Medicine

## 2023-08-17 ENCOUNTER — Ambulatory Visit (HOSPITAL_COMMUNITY)
Admission: EM | Admit: 2023-08-17 | Discharge: 2023-08-17 | Disposition: A | Discharge status: Home / Self Care | Payer: Self-pay | Attending: Family Medicine | Admitting: Family Medicine

## 2023-08-17 DIAGNOSIS — M5412 Radiculopathy, cervical region: Secondary | ICD-10-CM

## 2023-08-17 MED ORDER — LIDOCAINE HCL (PF) 1 % IJ SOLN
INTRAMUSCULAR | Status: AC
  Filled 2023-08-17: qty 30

## 2023-08-17 MED ORDER — LIDOCAINE HCL (PF) 1 % IJ SOLN
5.0000 mL | Freq: Once | INTRAMUSCULAR | Status: AC
  Administered 2023-08-17: 5 mL

## 2023-08-17 NOTE — ED Triage Notes (Signed)
Pain in the right arm onset one month ago. No known injuries or falls. Patient does use the right hand for repetitive motions at work. Pain starts in the upper right back and goes all the way down the right arm. States there is numbness, tingling, pain, and stiffness.   No history of arm or joint problems.

## 2023-08-17 NOTE — ED Provider Notes (Signed)
[facility]    CSN: [id number] Arrival date & time: 08/17/23  0803      History   Chief Complaint Chief Complaint  Patient presents with   Arm Pain    HPI [name] is a 52 y.o. female.   [name] is a 52 year old female here for neck and right arm pain with associated numbness that has been intermittent over the last 2 3 months.  She denies any trauma to the areas.  She has intermittent weakness with these episodes causing her to drop objects and they self resolved.  She has tried Tylenol which has not given her any benefit.  She denies any dizziness, vision changes, headaches, confusion, chest pain, palpitations, difficulty breathing.  She has not had this worked up in the past.  The history is provided by the patient.  Arm Pain Pertinent negatives include no chest pain, no abdominal pain, no headaches and no shortness of breath.    Past Medical History:  Diagnosis Date   Abnormal Pap smear of cervix    Abnormal uterine bleeding    Adenomyosis    Anemia    Anxiety    Bacterial vaginosis    Dark stools    Depression    Elevated alkaline phosphatase measurement    Episodic lightheadedness    ETOH abuse    GERD (gastroesophageal reflux disease)    Headache    Hot flashes    Hypertension    Nausea    Near syncope    Palpitations    Postmenopause bleeding    Prediabetes    Sepsis (HCC)    SOB (shortness of breath)    TOA (tubo-ovarian abscess) 05/28/2020    Patient Active Problem List   Diagnosis Date Noted   Adjustment disorder with mixed anxiety and depressed mood 01/30/2023   Alcohol use disorder 01/30/2023   Insomnia 01/30/2023   Tobacco dependence 01/30/2023   Delta-9-tetrahydrocannabinol (THC) dependence (HCC) 01/30/2023   Suicidal ideation 01/29/2023   Homicidal ideation 01/29/2023   History of postmenopausal bleeding 06/25/2020   Dark stools 05/26/2020   Pre-diabetes 05/26/2020   TOA (tubo-ovarian abscess) 05/25/2020    Postmenopausal bleeding 05/25/2020   Elevated alkaline phosphatase measurement 05/25/2020   Adenomyosis 07/15/2015   ETOH abuse 07/14/2015   Abnormal uterine bleeding (AUB) 07/14/2015    Past Surgical History:  Procedure Laterality Date   abscess     DENTAL SURGERY     ENDOMETRIAL BIOPSY  08/12/2020       IR RADIOLOGIST EVAL & MGMT  06/09/2020    OB History     Gravida  4   Para  3   Term  3   Preterm  0   AB  1   Living  3      SAB  0   IAB  0   Ectopic  0   Multiple      Live Births           Obstetric Comments  Vag delivery x 3          Home Medications    Prior to Admission medications   Medication Sig Start Date End Date Taking? Authorizing Provider  amLODipine (NORVASC) 10 MG tablet Take 1 tablet (10 mg total) by mouth daily. 02/02/23   Sarita Bottom, MD  escitalopram (LEXAPRO) 10 MG tablet Take 1 tablet (10 mg total) by mouth daily. 02/03/23   Sarita Bottom, MD  metoprolol tartrate (LOPRESSOR) 50 MG tablet Take 1 tablet (50 mg total)  by mouth 2 (two) times daily. 02/02/23   Sarita Bottom, MD  naltrexone (DEPADE) 50 MG tablet Take 1 tablet (50 mg total) by mouth daily. 02/03/23   Sarita Bottom, MD  nicotine polacrilex (NICORETTE) 2 MG gum Take 1 each (2 mg total) by mouth as needed for smoking cessation. 02/02/23   Sarita Bottom, MD    Family History Family History  Problem Relation Age of Onset   Hypertension Mother    Hypertension Father     Social History Social History   Tobacco Use   Smoking status: Every Day    Current packs/day: 2.00    Types: Cigarettes   Smokeless tobacco: Never  Vaping Use   Vaping status: Never Used  Substance Use Topics   Alcohol use: Yes    Comment: drinks daily, 2- 40oz today    Drug use: No     Allergies   Penicillins and Morphine and codeine   Review of Systems Review of Systems  Constitutional:  Negative for activity change, chills and diaphoresis.  Respiratory:  Negative for chest tightness and  shortness of breath.   Cardiovascular:  Negative for chest pain, palpitations and leg swelling.  Gastrointestinal:  Negative for abdominal pain.  Musculoskeletal:  Positive for arthralgias, myalgias and neck pain. Negative for back pain, gait problem, joint swelling and neck stiffness.  Skin:  Negative for color change and wound.  Neurological:  Positive for numbness (Intermittent in the right fingers). Negative for dizziness, tremors, seizures, syncope, facial asymmetry, speech difficulty, weakness, light-headedness and headaches.     Physical Exam Triage Vital Signs ED Triage Vitals  Encounter Vitals Group     BP 08/17/23 0826 (!) 178/111     Systolic BP Percentile --      Diastolic BP Percentile --      Pulse Rate 08/17/23 0826 87     Resp 08/17/23 0826 16     Temp 08/17/23 0826 98.2 F (36.8 C)     Temp Source 08/17/23 0826 Oral     SpO2 08/17/23 0826 98 %     Weight --      Height 08/17/23 0826 5\' 2"  (1.575 m)     Head Circumference --      Peak Flow --      Pain Score 08/17/23 0825 9     Pain Loc --      Pain Education --      Exclude from Growth Chart --    No data found.  Updated Vital Signs BP (!) 178/111 (BP Location: Left Arm)   Pulse 87   Temp 98.2 F (36.8 C) (Oral)   Resp 16   Ht 5\' 2"  (1.575 m)   LMP 04/11/2020 (Approximate)   SpO2 98%   BMI 24.51 kg/m   Visual Acuity Right Eye Distance:   Left Eye Distance:   Bilateral Distance:    Right Eye Near:   Left Eye Near:    Bilateral Near:     Physical Exam Vitals reviewed.  Constitutional:      General: She is not in acute distress.    Appearance: Normal appearance. She is not ill-appearing, toxic-appearing or diaphoretic.  HENT:     Head: Normocephalic and atraumatic.  Eyes:     General: No scleral icterus.    Extraocular Movements: Extraocular movements intact.     Pupils: Pupils are equal, round, and reactive to light.  Cardiovascular:     Rate and Rhythm: Normal rate and regular rhythm.  Pulses: Normal pulses.  Pulmonary:     Effort: Pulmonary effort is normal.  Musculoskeletal:     Cervical back: Normal range of motion and neck supple. Tenderness (Paraspinal muscles) present. No rigidity.     Comments: Inspection of the neck shows no abnormalities.  Range of motion normal in all directions.  No tenderness to palpation over the spinous processes.  Tightness and spasms appreciated of the trapezius muscle, levator scapula, and rhomboid muscles.  Palpation of the spasm of the rhomboid reproduces pain and numbness down the right arm. Inspection of the right shoulder shows no deformities.  There is full range of motion of the shoulder without pain.  Strength 5/5 in all directions.  No tenderness to palpation.  Load-and-shift negative.  Empty can, O'Briens, Hawkins, Neer's, crossover all negative.  Biceps tendon reflexes 2+ and equal bilaterally.  Grip strength 5/5.  Skin:    General: Skin is warm.     Capillary Refill: Capillary refill takes 2 to 3 seconds.  Neurological:     General: No focal deficit present.     Mental Status: She is alert and oriented to person, place, and time.     Cranial Nerves: No cranial nerve deficit.     Sensory: No sensory deficit.     Motor: No weakness.  Psychiatric:        Mood and Affect: Mood normal.        Behavior: Behavior normal.      UC Treatments / Results  Labs (all labs ordered are listed, but only abnormal results are displayed) Labs Reviewed - No data to display  EKG   Radiology DG Cervical Spine 2-3 Views  Result Date: 08/17/2023 CLINICAL DATA:  Neck pain with radiculopathy. Pain starts in the upper right back and extends into the right upper extremity. EXAM: CERVICAL SPINE - 2 VIEW COMPARISON:  None Available. FINDINGS: The cervical spine is visualized from the skull base 2 C7. The cervicothoracic junction is not well visualized. Prevertebral soft tissues are within normal limits. Alignment is anatomic. Endplate  degenerative changes and chronic loss of disc height is present at C6-7 and C7-T1. Uncovertebral spurring is evident at these levels. The lung apices are clear. IMPRESSION: 1. 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 2. No acute abnormality. Electronically Signed   By: Marin Roberts M.D.   On: 08/17/2023 09:20    Procedures ED EKG  Date/Time: 08/17/2023 11:29 AM  Performed by: Ivor Messier, MD Authorized by: Ivor Messier, MD   Previous ECG:    Previous ECG:  Compared to current   Similarity:  No change Interpretation:    Interpretation: normal   Rate:    ECG rate assessment: normal   Rhythm:    Rhythm: sinus rhythm   Ectopy:    Ectopy: none   QRS:    QRS axis:  Normal   QRS intervals:  Normal   QRS conduction: normal   ST segments:    ST segments:  Normal T waves:    T waves: normal   Q waves:    Abnormal Q-waves: not present   Nerve Block  Date/Time: 08/17/2023 11:31 AM  Performed by: Ivor Messier, MD Authorized by: Ivor Messier, MD   Consent:    Consent obtained:  Verbal   Consent given by:  Patient   Risks, benefits, and alternatives were discussed: yes     Risks discussed:  Allergic reaction, infection, nerve damage, swelling, unsuccessful block, pain and bleeding  Alternatives discussed:  No treatment, delayed treatment and alternative treatment Universal protocol:    Imaging studies available: yes     Patient identity confirmed:  Verbally with patient Indications:    Indications:  Pain relief Location:    Nerve block body site: Right rhomboid, and trapezius muscle.   Laterality:  Right Pre-procedure details:    Skin preparation:  Alcohol   Preparation: Patient was prepped and draped in usual sterile fashion   Skin anesthesia:    Skin anesthesia method:  None Procedure details:    Block needle gauge:  25 G   Anesthetic injected:  Lidocaine 1% w/o epi   Steroid injected:  None   Additive injected:  None    Injection procedure:  Negative aspiration for blood   Paresthesia:  Immediately resolved Post-procedure details:    Dressing:  None   Outcome:  Pain improved   Procedure completion:  Tolerated well, no immediate complications  (including critical care time)  Medications Ordered in UC Medications  lidocaine (PF) (XYLOCAINE) 1 % injection 5 mL (5 mLs Other Given 08/17/23 0920)    Initial Impression / Assessment and Plan / UC Course  I have reviewed the triage vital signs and the nursing notes.  Pertinent labs & imaging results that were available during my care of the patient were reviewed by me and considered in my medical decision making (see chart for details).    Cervical radiculopathy -Caya's hx and sxs consistent w/ cervical radicular pain vs muscular nerve entrapment vs cardiac etiology (low concern given exam and EKG as below) - EKG: Normal sinus rhythm, no axis deviations, no signs of ischemia or infarct. - Cervical Xray: On individual review there are no acute fractures noted.  Posterior articular surfaces of C6-C7 show chronic degenerative changes. -Her symptoms are reproduced Bole on palpation of the muscle spasm in the rhomboid muscle.  I am unable to rule out cervical etiology at this point there is a muscular entrapment component to her symptoms. - We discussed the options for treatment.  The patient elected for trigger point injection as above.  Tolerated well without complications.  Symptoms improved prior to discharge home. - We will give a referral for orthopedic follow-up for cervical region radiculitis. -Stretching and strengthening exercises given as well as links to online references. - The plan and follow-up were discussed with the patient.  They voiced understanding and agreement.   Final Clinical Impressions(s) / UC Diagnoses   Final diagnoses:  Cervical radiculitis   Discharge Instructions   None    ED Prescriptions   None    PDMP not reviewed  this encounter.   Ivor Messier, MD 08/17/23 1134

## 2024-08-04 ENCOUNTER — Emergency Department (HOSPITAL_COMMUNITY)
Admission: EM | Admit: 2024-08-04 | Discharge: 2024-08-04 | Discharge status: Against Medical Advice | Payer: Self-pay | Attending: Emergency Medicine | Admitting: Emergency Medicine

## 2024-08-04 ENCOUNTER — Encounter (HOSPITAL_COMMUNITY): Payer: Self-pay | Admitting: *Deleted

## 2024-08-04 ENCOUNTER — Other Ambulatory Visit: Payer: Self-pay

## 2024-08-04 DIAGNOSIS — Z5321 Procedure and treatment not carried out due to patient leaving prior to being seen by health care provider: Secondary | ICD-10-CM | POA: Insufficient documentation

## 2024-08-04 DIAGNOSIS — M791 Myalgia, unspecified site: Secondary | ICD-10-CM | POA: Insufficient documentation

## 2024-08-04 NOTE — ED Triage Notes (Signed)
 Pt here from home with c/o whole body aches

## 2024-08-04 NOTE — ED Notes (Signed)
 The  pt took tylenol   approx I hr ago

## 2024-08-04 NOTE — ED Notes (Signed)
 Pt stated she did not want to wait, and will follow up with urgent care in the am.

## 2024-08-04 NOTE — ED Notes (Signed)
 The pt refused to go to the waiting room refused to sign  mse then walked out saying that she would go to urgent care tomorrow

## 2024-08-04 NOTE — ED Notes (Signed)
Pt t

## 2024-08-05 ENCOUNTER — Encounter (HOSPITAL_COMMUNITY): Payer: Self-pay | Admitting: *Deleted

## 2024-08-05 ENCOUNTER — Emergency Department (HOSPITAL_COMMUNITY)
Admission: EM | Admit: 2024-08-05 | Discharge: 2024-08-05 | Disposition: A | Discharge status: Home / Self Care | Payer: Self-pay | Attending: Emergency Medicine | Admitting: Emergency Medicine

## 2024-08-05 DIAGNOSIS — Z79899 Other long term (current) drug therapy: Secondary | ICD-10-CM | POA: Insufficient documentation

## 2024-08-05 DIAGNOSIS — F17200 Nicotine dependence, unspecified, uncomplicated: Secondary | ICD-10-CM | POA: Insufficient documentation

## 2024-08-05 DIAGNOSIS — J069 Acute upper respiratory infection, unspecified: Secondary | ICD-10-CM | POA: Insufficient documentation

## 2024-08-05 DIAGNOSIS — M545 Low back pain, unspecified: Secondary | ICD-10-CM | POA: Insufficient documentation

## 2024-08-05 MED ORDER — KETOROLAC TROMETHAMINE 15 MG/ML IJ SOLN
15.0000 mg | Freq: Once | INTRAMUSCULAR | Status: AC
  Administered 2024-08-05: 15 mg via INTRAMUSCULAR
  Filled 2024-08-05: qty 1

## 2024-08-05 NOTE — ED Triage Notes (Signed)
 Pt here c/o continued back pain. States that she left earlier d/t wait and wanting to go home to sleep, but now wants to be seen again.

## 2024-08-05 NOTE — ED Notes (Signed)
Pt provided with heat pack.

## 2024-08-05 NOTE — ED Notes (Signed)
 This RN reviewed discharge instructions with patient. She verbalized understanding and denied any further questions. PT well appearing upon discharge and reports tolerable pain. Pt ambulated with stable gait to exit. Pt endorses ride home.

## 2024-08-05 NOTE — Discharge Instructions (Addendum)
 Your back pain is likely musculoskeletal in nature.  Consider using heating pads and anti-inflammatory medication over-the-counter for relief.  You were also seen this evening for symptoms consistent with an upper respiratory infection.  Continue with over-the-counter medications for symptom relief.  Your body will need time to fight the infection. I recommend following up with a primary care provider for further evaluation of your back pain as needed.  If you develop any life-threatening symptoms return to the emergency department.

## 2024-08-05 NOTE — ED Provider Notes (Signed)
[place] EMERGENCY DEPARTMENT AT [facility] Provider Note   CSN: [id number] Arrival date & time: 08/05/24  0155     Patient presents with: Back Pain   [name] is a 53 y.o. female.  Patient with past medical history significant for alcohol abuse, prediabetes, adjustment disorder, THC dependence, GERD presents the emergency room complaining of upper respiratory symptoms including cough and congestion.  Patient also complains of bilateral low back pain.  Patient states that the cough and congestion began a few days ago.  She has tried over-the-counter remedies with little relief.  She denies shortness of breath, chest pain, abdominal pain, nausea, vomiting, known fever. Patient complains of low back pain which began 1 week ago.  She states she stands throughout her shift when working.  She denies any known injury to the area.  Denies urinary symptoms including retention/incontinence, fecal incontinence, saddle anesthesia. Of note patient was triaged and blood work and respiratory panel swabs were completed prior to orders being placed.  Patient eloped from the emergency department not wanting to wait for treatment.  These samples were discarded as no orders had been placed.    Back Pain      Prior to Admission medications   Medication Sig Start Date End Date Taking? Authorizing Provider  amLODipine  (NORVASC ) 10 MG tablet Take 1 tablet (10 mg total) by mouth daily. 02/02/23   Evelena Figures, MD  escitalopram  (LEXAPRO ) 10 MG tablet Take 1 tablet (10 mg total) by mouth daily. 02/03/23   Evelena Figures, MD  metoprolol  tartrate (LOPRESSOR ) 50 MG tablet Take 1 tablet (50 mg total) by mouth 2 (two) times daily. 02/02/23   Evelena Figures, MD  naltrexone  (DEPADE) 50 MG tablet Take 1 tablet (50 mg total) by mouth daily. 02/03/23   Evelena Figures, MD  nicotine  polacrilex (NICORETTE ) 2 MG gum Take 1 each (2 mg total) by mouth as needed for smoking cessation. 02/02/23   Evelena Figures, MD     Allergies: Penicillins and Morphine and codeine    Review of Systems  Musculoskeletal:  Positive for back pain.    Updated Vital Signs BP (!) 140/96 (BP Location: Right Arm)   Pulse 82   Temp 98.2 F (36.8 C) (Oral)   Resp 18   Ht 5' 2 (1.575 m)   Wt 60.8 kg   LMP 04/11/2020 (Approximate)   SpO2 100%   BMI 24.52 kg/m   Physical Exam Vitals and nursing note reviewed.  Constitutional:      General: She is not in acute distress.    Appearance: She is well-developed.  HENT:     Head: Normocephalic and atraumatic.     Nose: Congestion present. No rhinorrhea.     Mouth/Throat:     Mouth: Mucous membranes are moist.     Pharynx: No oropharyngeal exudate or posterior oropharyngeal erythema.  Eyes:     Conjunctiva/sclera: Conjunctivae normal.     Pupils: Pupils are equal, round, and reactive to light.  Cardiovascular:     Rate and Rhythm: Normal rate and regular rhythm.  Pulmonary:     Effort: Pulmonary effort is normal. No respiratory distress.     Breath sounds: Normal breath sounds.  Abdominal:     Palpations: Abdomen is soft.     Tenderness: There is no abdominal tenderness.  Musculoskeletal:        General: Tenderness present. No swelling.     Cervical back: Neck supple.     Comments: Bilateral lumbar tenderness with no midline  spinal tenderness.  Patient is ambulatory with a normal gait.  No weakness or sensory deficit in bilateral lower extremities  Skin:    General: Skin is warm and dry.     Capillary Refill: Capillary refill takes less than 2 seconds.  Neurological:     Mental Status: She is alert.  Psychiatric:        Mood and Affect: Mood normal.     (all labs ordered are listed, but only abnormal results are displayed) Labs Reviewed - No data to display  EKG: None  Radiology: No results found.   Procedures   Medications Ordered in the ED  ketorolac  (TORADOL ) 15 MG/ML injection 15 mg (15 mg Intramuscular Given 08/05/24 0353)                                     Medical Decision Making Risk Prescription drug management.   This patient presents to the ED for concern of back pain and upper respiratory symptoms, this involves an extensive number of treatment options, and is a complaint that carries with it a high risk of complications and morbidity.  The differential diagnosis for back pain includes soft tissue injury, fracture, dislocation, herniation, cauda equina, others.  Differential diagnosis for the upper respiratory symptoms includes viral illness such as COVID, flu, and others, pneumonia, others   Co morbidities / Chronic conditions that complicate the patient evaluation  EtOH abuse   Additional history obtained:  Additional history obtained from EMR   Lab Tests:  I offered to test the patient for COVID, flu, and RSV but the patient declined as she had been swabbed earlier.  The results of this test would not change management and this seems reasonable   Imaging Studies ordered:  Lungs are clear to auscultation, no fever.  No indication for chest x-ray    Problem List / ED Course / Critical interventions / Medication management   I ordered medication including Toradol  Reevaluation of the patient after these medicines showed that the patient improved   Social Determinants of Health:  Patient is a daily smoker, has no reported health insurance   Test / Admission - Considered:  Patient's back pain seems musculoskeletal in nature.  She has bilateral low back pain with no midline spinal tenderness.  She has no range of motion difficulties.  No radiation of symptoms into her lower extremities.  Patient treated with Toradol  and a heating pack with relief of symptoms.  Patient to continue management at home with over-the-counter medication and heating pad Patient has symptoms consistent with an upper respiratory infection.  Unfortunately she left the facility prior to orders being placed during her earlier  visit.  Those samples were discarded.  I discussed doing a repeat swab but the patient declined.  As this would likely not change management in any meaningful way this is reasonable.  Patient will continue with symptomatic management at home.  I explained that viral illnesses need time for improvement.  The patient voices understanding. Patient discharged home in stable condition with no indication for further emergent workup or admission.  Return precautions provided.      Final diagnoses:  Acute bilateral low back pain without sciatica  Upper respiratory tract infection, unspecified type    ED Discharge Orders     None          Logan Ubaldo KATHEE DEVONNA 08/05/24 0406    Mesner, Selinda, MD  08/05/24 0705  

## 2024-09-06 ENCOUNTER — Ambulatory Visit (HOSPITAL_COMMUNITY)
Admission: EM | Admit: 2024-09-06 | Discharge: 2024-09-06 | Disposition: A | Discharge status: Home / Self Care | Attending: Psychiatry | Admitting: Psychiatry

## 2024-09-06 DIAGNOSIS — F331 Major depressive disorder, recurrent, moderate: Secondary | ICD-10-CM | POA: Insufficient documentation

## 2024-09-06 DIAGNOSIS — F411 Generalized anxiety disorder: Secondary | ICD-10-CM | POA: Insufficient documentation

## 2024-09-06 DIAGNOSIS — F1090 Alcohol use, unspecified, uncomplicated: Secondary | ICD-10-CM | POA: Insufficient documentation

## 2024-09-06 DIAGNOSIS — F1290 Cannabis use, unspecified, uncomplicated: Secondary | ICD-10-CM | POA: Insufficient documentation

## 2024-09-06 DIAGNOSIS — G4700 Insomnia, unspecified: Secondary | ICD-10-CM | POA: Insufficient documentation

## 2024-09-06 MED ORDER — HYDROXYZINE HCL 25 MG PO TABS
25.0000 mg | ORAL_TABLET | Freq: Three times a day (TID) | ORAL | 0 refills | Status: AC | PRN
Start: 2024-09-06 — End: ?

## 2024-09-06 NOTE — ED Provider Notes (Signed)
 Behavioral Health Urgent Care Medical Screening Exam  Patient Name: [name] MRN: [id number] Date of Evaluation: 09/06/24 Chief Complaint:  Depression and therapy Diagnosis:  Final diagnoses:  Moderate episode of recurrent major depressive disorder (HCC)  Anxiety state    History of Present illness: [name] 53 y.o., female patient presented to [facility] as a voluntary walk in accompanied by BHRT with complaints of depression and wanting to talk to someone. Annabelle Pesa, is seen face to face by this provider and chart reviewed on 09/06/24.  Per chart review, pt has PPHx of alcohol use disorder, insomnia, MDD and GAD. Pt is not currently receiving outpatient treatment and was last hospitalized at [facility] in 2024, discharged on Lexapro  and Naltrexone  but did not continue treatment after discharge.   On evaluation [name] reports that she has been dealing with depression since the death of her father 2 years ago, who she was very close with. Pt states that she is now in the middle of a separation from her husband that started in June 2025. Pt states that she is now living and supporting herself and her adult children by herself. She reports currently working part-time at [employer]. Pt states that she had a breakdown yesterday after getting cursed out and threatened by customer at work, then her ex-husband called the cops on her making false accusations, and her son asked her to take custody of his son because they may be homeless soon. She reports that she felt very overwhelmed and has been crying since last night. She denies current SI/HI and AVH. She reports decreased sleep and appetite. She also endorses THC and ETOH us  occasionally but has been using more these past couple of months due to stress. She endorses daily THC use but denies drinking alcohol daily. Pt reports feeling very restless, easily irritated and constantly stressed/ anxious. Pt reports feeling extremely angry and  thinking about wanting to beat someone up but does not act on these thoughts. She states that just talking with this provider about her troubles in this moment has made her feel a lot better and that she is hopeful that therapy will do the same. We discussed recommendation for pt referral to [facility] program for intensive therapy and continued medication management. Pt is very interested but worried about her work schedule, she is wanting to discuss the program further with PHP staff and find a way to make it work. We also discussed starting Hydroxyzine  25mg  PRN for anxiety and sleep. Pt is appreciative and agreeable to treatment plan.   During evaluation Terry Riepe is standing in hallway talking with staff, in no acute distress. She is alert & oriented x 4, calm, cooperative and attentive for this assessment.  Her mood is depressed but pleasant with congruent affect.  She has normal speech, and restless behavior.  Objectively there is no evidence of psychosis/mania or delusional thinking. Pt does not appear to be responding to internal or external stimuli.  Patient is able to converse coherently, goal directed thoughts, no distractibility, or pre-occupation.  She denies current suicidal/self-harm/homicidal ideation, psychosis, and paranoia.  No aggression or agitations during assessment. Patient answered  assessment questions appropriately.      Flowsheet Row ED from 09/06/2024 in [facility] ED from 08/05/2024 in [facility] Emergency Department at [facility] ED from 08/04/2024 in [facility] Emergency Department at [facility]  C-SSRS RISK CATEGORY No Risk No Risk No Risk    Psychiatric Specialty Exam  Presentation  General Appearance:Appropriate for Environment  Eye Contact:Good  Speech:Clear and Coherent  Speech Volume:Increased  Handedness:Right   Mood and Affect  Mood: Depressed  Affect: Appropriate; Full Range; Depressed   Thought  Process  Thought Processes: Coherent  Descriptions of Associations:Circumstantial  Orientation:Full (Time, Place and Person)  Thought Content:Logical  Diagnosis of Schizophrenia or Schizoaffective disorder in past: No data recorded  Hallucinations:None  Ideas of Reference:None  Suicidal Thoughts:No  Homicidal Thoughts:No   Sensorium  Memory: Immediate Fair; Recent Fair  Judgment: Fair  Insight: Poor   Executive Functions  Concentration: Fair  Attention Span: Fair  Recall: Fair  Fund of Knowledge: Fair  Language: Good   Psychomotor Activity  Psychomotor Activity: Restlessness   Assets  Assets: Communication Skills; Desire for Improvement; Financial Resources/Insurance; Housing; Physical Health; Resilience; Social Support   Sleep  Sleep: Fair  Number of hours:  5   Physical Exam: Physical Exam Vitals and nursing note reviewed.  Constitutional:      Appearance: Normal appearance.  HENT:     Head: Normocephalic.     Nose: Nose normal.  Eyes:     Extraocular Movements: Extraocular movements intact.  Cardiovascular:     Rate and Rhythm: Normal rate.     Comments: hypertension Pulmonary:     Effort: Pulmonary effort is normal.  Musculoskeletal:        General: Normal range of motion.     Cervical back: Normal range of motion.  Neurological:     General: No focal deficit present.     Mental Status: She is alert and oriented to person, place, and time.    Review of Systems  Constitutional: Negative.   HENT: Negative.    Eyes: Negative.   Respiratory: Negative.    Cardiovascular: Negative.   Gastrointestinal: Negative.   Genitourinary: Negative.   Musculoskeletal: Negative.   Neurological: Negative.   Endo/Heme/Allergies: Negative.   Psychiatric/Behavioral:  Positive for depression and substance abuse. The patient is nervous/anxious.    Blood pressure (!) 148/92, last menstrual period 04/11/2020. There is no height or weight on  file to calculate BMI.  Musculoskeletal: Strength & Muscle Tone: within normal limits Gait & Station: normal Patient leans: N/A   [facility] MSE Discharge Disposition for Follow up and Recommendations: Based on my evaluation the patient does not appear to have an emergency medical condition and can be discharged with resources and follow up care in outpatient services for Medication Management, Partial Hospitalization Program, and Individual Therapy Pt is discharged from [facility] with referral sent to the [facility] Partial Hospitalization Program to begin receiving treatment for her depressive symptoms and worsening anxiety/ irritability. Pt was provided with a 10 day supply of Hydroxyzine  25mg  PRN TID for anxiety symptoms. Pt educated on this medication and verbalizes understanding. She denies current SI/HI and psychotic symptoms. Pt encouraged to return for further evaluation if suicidal or homicidal ideations begin or become active. Pt is agreeable to this plan.  Also discussed medical history of hypertension and the importance of following up with PCP for ongoing medication treatment.  Pt was provided with transportation back home via BHRT.   Alan JAYSON Mcardle, NP 09/06/2024, 4:14 PM

## 2024-09-06 NOTE — Progress Notes (Signed)
   09/06/24 1251  [facility] Triage Screening (Walk-ins at [facility] only)  How Did You Hear About Us ? Self  What Is the Reason for Your Visit/Call Today? Patient is a 53 year old female that presents this date voluntary brought in by [facility] after she contacted emergency services this date stating she needs assistance with her ongoing depression. Patient denies any SI, HI or AVH. Patient has a prior PMHx significant for MDD and was seen in 2024 when she presented with similar symptoms. Patient states they recently went through a separation in the last two months and is currently residing alone which she reports is a major factor associated with her depression. Patient states she, has a beer and smokes a little weed, sporadically although reports it, isn't a promlem. Patient states she is currently not receiving any OP services or medication management, counseling at this time. Patient is requesting resources to assist with ongoing needs.  How Long Has This Been Causing You Problems? 1 wk - 1 month  Have You Recently Had Any Thoughts About Hurting Yourself? No  Are You Planning to Commit Suicide/Harm Yourself At This time? No  Have you Recently Had Thoughts About Hurting Someone Sherral? No  Are You Planning To Harm Someone At This Time? No  Physical Abuse Denies  Verbal Abuse Denies  Sexual Abuse Denies  Exploitation of patient/patient's resources Denies  Self-Neglect Denies  Possible abuse reported to: Other (Comment) (NA)  Are you currently experiencing any auditory, visual or other hallucinations? No  Have You Used Any Alcohol or Drugs in the Past 24 Hours? No  Do you have any current medical co-morbidities that require immediate attention? No  Clinician description of patient physical appearance/behavior: Patient presents with a pleasant affect  What Do You Feel Would Help You the Most Today? Stress Management;Treatment for Depression or other mood problem  If access to [facility] was not available,  would you have sought care in the Emergency Department? No  Determination of Need Routine (7 days)  Options For Referral Outpatient Therapy

## 2024-09-06 NOTE — Discharge Instructions (Signed)
 Discharge recommendations:   Medications: Start taking Hydroxyzine  25mg  as needed for anxiety up to 3 times a day. Patient is to take medications as prescribed. The patient or patient's guardian is to contact a medical professional and/or outpatient provider to address any new side effects that develop. The patient or the patient's guardian should update outpatient providers of any new medications and/or medication changes.    Outpatient Follow up: Referral sent to [facility] for intensive therapy program. Please review list of outpatient resources for psychiatry and counseling. Please follow up with your primary care provider for all medical related needs.    Therapy: We recommend that patient participate in individual therapy to address mental health concerns.   Safety:   The following safety precautions should be taken:   No sharp objects. This includes scissors, razors, scrapers, and putty knives.   Chemicals should be removed and locked up.   Medications should be removed and locked up.   Weapons should be removed and locked up. This includes firearms, knives and instruments that can be used to cause injury.   The patient should abstain from use of illicit substances/drugs and abuse of any medications.  If symptoms worsen or do not continue to improve or if the patient becomes actively suicidal or homicidal then it is recommended that the patient return to the closest hospital emergency department, the [facility], or call 911 for further evaluation and treatment. National Suicide Prevention Lifeline 1-800-SUICIDE or 4354535781.  About 988 988 offers 24/7 access to trained crisis counselors who can help people experiencing mental health-related distress. People can call or text 988 or chat 988lifeline.org for themselves or if they are worried about a loved one who may need crisis support.    You are encouraged to follow up with [facility] for outpatient treatment.  Walk in/ Open Access Hours: Monday - Friday 8AM - 10AM (To see provider and therapist) PLEASE ARRIVE AROUND 7:00AM for first walk-in assessment  Second Floor [facility] [address] [place], [place] [phone]

## 2024-09-10 ENCOUNTER — Telehealth (HOSPITAL_COMMUNITY): Payer: Self-pay | Admitting: Professional

## 2024-09-10 NOTE — Telephone Encounter (Signed)
 See call log

## 2024-09-30 ENCOUNTER — Ambulatory Visit (HOSPITAL_COMMUNITY)
Admission: EM | Admit: 2024-09-30 | Discharge: 2024-09-30 | Disposition: A | Discharge status: Home / Self Care | Attending: Psychiatry | Admitting: Psychiatry

## 2024-09-30 DIAGNOSIS — F411 Generalized anxiety disorder: Secondary | ICD-10-CM | POA: Insufficient documentation

## 2024-09-30 DIAGNOSIS — F332 Major depressive disorder, recurrent severe without psychotic features: Secondary | ICD-10-CM | POA: Insufficient documentation

## 2024-09-30 DIAGNOSIS — Z635 Disruption of family by separation and divorce: Secondary | ICD-10-CM | POA: Insufficient documentation

## 2024-09-30 DIAGNOSIS — Z59811 Housing instability, housed, with risk of homelessness: Secondary | ICD-10-CM | POA: Insufficient documentation

## 2024-09-30 NOTE — Discharge Instructions (Addendum)
 For [facility] residents who are uninsured or have Medicare/Medicaid:  Please come to [facility] (this facility) during walk in hours for appointment with psychiatrist for further medication management and for therapists for therapy.   Walk in hours are 8-11 AM Monday through Thursday for medication management.  Therapy walk in hours are Monday-Wednesday 8 AM-1PM.   It is first come, first -serve; it is best to arrive by 7:00 AM.   On Friday from 1 pm to 4 pm for therapy intake only. Please arrive by 12:00 pm as it is  first come, first -serve.    When you arrive please go upstairs for your appointment. If you are unsure of where to go, inform the front desk that you are here for a walk in appointment and they will assist you with directions upstairs.  Follow-up recommendations:  Activity:  Normal, as tolerated Diet:  Per PCP recommendation  Patient is instructed prior to discharge to: Take all medications as prescribed by her mental healthcare provider. Report any adverse effects and/or reactions from the medicines to her outpatient provider promptly. Patient has been instructed & cautioned: To not engage in alcohol and or illegal drug use while on prescription medicines.  In the event of worsening symptoms, patient is instructed to call the crisis hotline at 988, 911 and or go to the nearest ED for appropriate evaluation and treatment of symptoms. To follow-up with her primary care provider for your other medical issues, concerns and or health care needs.   Address:  [address], in [place], [address] Ph: [phone]

## 2024-09-30 NOTE — ED Provider Notes (Signed)
 Behavioral Health Urgent Care Medical Screening Exam  Patient Name: [name] MRN: [id number] Date of Evaluation: 09/30/24 Chief Complaint: anxiety Diagnosis:  Final diagnoses:  Severe episode of recurrent major depressive disorder, without psychotic features (HCC)  Anxiety state    History of Present illness: [name] is a 53 y.o. female with a past psychiatric history of depression and anxiety not currently seeing a psychiatrist or therapist and is prescribed as needed hydroxyzine  who presents to [facility] unaccompanied due to increased anxiety regarding her recent separation with her spouse 3 months ago.  She states that her and her husband previously would still act like how they were before the separation and she states she would still receive help with chores from her ex-husband until end of October when she states that he cut her out of his life.  She also states that he would try to make false legal claims like putting a 50B on her but he would later drop the charge.  On Friday, her ex-husband called her and stated that he has pancreatitis and that he is in the hospital.  She then went to visit him and she said that she saw her ex-husband's new significant other and she turned around before her ex-husband saw her.  She states that she went home and started crying.  She states that her coping strategies have been watching movies, journaling, and talking to her work friend regarding the separation.  She states that she has been talking to her son who came over on Saturday and she states that help her feel better.  She also states that she is behind on her rent and she is worried of being unhoused.    She currently works at [employer].  She currently denies active and passive SI and she contracts for safety.  She denies HI, AVH, and paranoia.  She is interested in finding a therapist to talk about this recent life transition.  I discussed walk-in hours in the [facility] behavioral  health clinic and she is agreeable to going when she is able.   Per [facility] triage note, [name] is a 53 year old female with hx of depression and anxiety. She reports experiencing increased anxiety following a separation from her spouse of 20 years, which occurred three months ago. She mentions that she saw her spouse with another woman during the separation, and this triggered her anxiety. She states that her anxiety has been affecting her job performance. Additionally, she is behind on her rent. [name] describes her coping mechanisms as isolating herself. She says, I'm tired of crying. She has tried reaching out to crisis hotlines for support but feels that she wasn't truly heard. [name] reports feeling this way since Saturday and Sunday morning, and as a result, she didn't go to work over the weekend. Fearing she may lose her job she is requesting a work note to excuse her absence from work on Saturday and Sunday. She denies any current or recent suicidal ideations and has no history of suicide attempts or gestures. She also denies homicidal ideation, auditory or visual hallucinations, and substance use, including alcohol and drugs. [name] has a history of inpatient treatment at [facility] 01/29/2023. She does not currently have a therapist or psychiatrist. She lives alone. Today, [name]'s goals is talk through her feelings and receive coping strategies to help her manage her fluctuating emotions.   Flowsheet Row ED from 09/30/2024 in [facility] ED from 09/06/2024 in [facility] ED from  08/05/2024 in [facility] Emergency Department at [facility]  C-SSRS RISK CATEGORY No Risk No Risk No Risk    Psychiatric Specialty Exam  Presentation  General Appearance:Appropriate for Environment; Fairly Groomed; Casual  Eye Contact:Fair  Speech:Clear and Coherent; Normal Rate  Speech Volume:Normal  Handedness:Right   Mood and Affect   Mood: Depressed; Anxious  Affect: Congruent   Thought Process  Thought Processes: Coherent; Linear  Descriptions of Associations:Intact  Orientation:Full (Time, Place and Person)  Thought Content:Logical  Diagnosis of Schizophrenia or Schizoaffective disorder in past: no Hallucinations:None  Ideas of Reference:None  Suicidal Thoughts:No  Homicidal Thoughts:No   Sensorium  Memory: Remote Good  Judgment: Fair  Insight: Fair   Art Therapist  Concentration: Good  Attention Span: Good  Recall: Good  Fund of Knowledge: Good  Language: Good   Psychomotor Activity  Psychomotor Activity: Normal   Assets  Assets: Communication Skills; Resilience; Desire for Improvement; Leisure Time; Social Support   Sleep  Sleep: Fair  Number of hours:  5   Physical Exam Constitutional:      Appearance: Normal appearance.  HENT:     Head: Normocephalic and atraumatic.  Cardiovascular:     Rate and Rhythm: Normal rate.  Pulmonary:     Effort: Pulmonary effort is normal.  Neurological:     General: No focal deficit present.     Mental Status: She is alert and oriented to person, place, and time.    Review of Systems  Constitutional:  Negative for chills and fever.  Respiratory:  Negative for shortness of breath.   Cardiovascular:  Negative for chest pain and palpitations.  Gastrointestinal:  Negative for nausea and vomiting.  Neurological:  Negative for headaches.   Last menstrual period 04/11/2020. There is no height or weight on file to calculate BMI.  Musculoskeletal: Strength & Muscle Tone: within normal limits Gait & Station: normal Patient leans: N/A   [facility] MSE Discharge Disposition for Follow up and Recommendations: Based on my evaluation the patient does not appear to have an emergency medical condition and can be discharged with resources and follow up care in outpatient services for Individual Therapy   Ismael KATHEE Franco,  MD 09/30/2024, 7:15 PM

## 2024-09-30 NOTE — Progress Notes (Signed)
   09/30/24 1654  [facility] Triage Screening (Walk-ins at [facility] only)  How Did You Hear About Us ? Self  What Is the Reason for Your Visit/Call Today? [name] is a 53 year old female with hx of depression and anxiety.  She reports experiencing increased anxiety following a separation from her spouse of 20 years, which occurred three months ago. She mentions that she saw her spouse with another woman during the separation, and this triggered her anxiety. She states that her anxiety has been affecting her job performance. Additionally, she is behind on her rent. [name] describes her coping mechanisms as isolating herself. She says, I'm tired of crying. She has tried reaching out to crisis hotlines for support but feels that she wasn't truly heard.  [name] reports feeling this way since Saturday and Sunday morning, and as a result, she didn't go to work over the weekend. Fearing she may lose her job she is requesting a work note to excuse her absence from work on Saturday and Sunday. She denies any current or recent suicidal ideations and has no history of suicide attempts or gestures. She also denies homicidal ideation, auditory or visual hallucinations, and substance use, including alcohol and drugs. [name] has a history of inpatient treatment at [facility] 01/29/2023. She does not currently have a therapist or psychiatrist. She lives alone.  Today, [name]'s goals is talk through her feelings and receive coping strategies to help her manage her fluctuating emotions.  How Long Has This Been Causing You Problems? 1 wk - 1 month  Have You Recently Had Any Thoughts About Hurting Yourself? No  Are You Planning to Commit Suicide/Harm Yourself At This time? No  Have you Recently Had Thoughts About Hurting Someone Sherral? No  Are You Planning To Harm Someone At This Time? No  Physical Abuse Denies  Verbal Abuse Denies  Sexual Abuse Denies  Exploitation of patient/patient's resources Denies  Self-Neglect Denies   Possible abuse reported to: Other (Comment)  Are you currently experiencing any auditory, visual or other hallucinations? No  Have You Used Any Alcohol or Drugs in the Past 24 Hours? No  Do you have any current medical co-morbidities that require immediate attention? No  Clinician description of patient physical appearance/behavior: Patient calm and cooperative. Also, anxious.  What Do You Feel Would Help You the Most Today? Treatment for Depression or other mood problem;Stress Management  If access to [facility] was not available, would you have sought care in the Emergency Department? No  Determination of Need Routine (7 days)  Options For Referral Medication Management;Intensive Outpatient Therapy;Partial Hospitalization
